# Patient Record
Sex: Male | Born: 1940 | Race: White | Hispanic: No | Marital: Married | Smoking: Former smoker
Health system: Southern US, Community
[De-identification: ages and names within clinical notes are randomized; demographics above are authoritative.]

## PROBLEM LIST (undated history)

## (undated) DIAGNOSIS — N400 Enlarged prostate without lower urinary tract symptoms: Secondary | ICD-10-CM

## (undated) DIAGNOSIS — K449 Diaphragmatic hernia without obstruction or gangrene: Secondary | ICD-10-CM

## (undated) DIAGNOSIS — E7800 Pure hypercholesterolemia, unspecified: Secondary | ICD-10-CM

## (undated) DIAGNOSIS — M1990 Unspecified osteoarthritis, unspecified site: Secondary | ICD-10-CM

## (undated) HISTORY — DX: Benign prostatic hyperplasia without lower urinary tract symptoms: N40.0

## (undated) HISTORY — PX: COLONOSCOPY: SHX174

## (undated) HISTORY — DX: Unspecified osteoarthritis, unspecified site: M19.90

## (undated) HISTORY — DX: Pure hypercholesterolemia, unspecified: E78.00

## (undated) HISTORY — DX: Diaphragmatic hernia without obstruction or gangrene: K44.9

## (undated) HISTORY — PX: TONSILLECTOMY: SUR1361

## (undated) HISTORY — PX: KNEE ARTHROSCOPY: SUR90

---

## 2002-05-20 ENCOUNTER — Encounter: Payer: Self-pay | Admitting: Emergency Medicine

## 2002-05-20 ENCOUNTER — Emergency Department (HOSPITAL_COMMUNITY): Admission: EM | Admit: 2002-05-20 | Discharge: 2002-05-20 | Payer: Self-pay | Admitting: Emergency Medicine

## 2002-11-26 ENCOUNTER — Encounter (INDEPENDENT_AMBULATORY_CARE_PROVIDER_SITE_OTHER): Payer: Self-pay

## 2002-11-26 ENCOUNTER — Ambulatory Visit (HOSPITAL_COMMUNITY): Admission: RE | Admit: 2002-11-26 | Discharge: 2002-11-26 | Payer: Self-pay | Admitting: Gastroenterology

## 2004-12-20 ENCOUNTER — Encounter: Admission: RE | Admit: 2004-12-20 | Discharge: 2004-12-20 | Payer: Self-pay | Admitting: Emergency Medicine

## 2009-11-20 ENCOUNTER — Encounter: Admission: RE | Admit: 2009-11-20 | Discharge: 2009-11-20 | Payer: Self-pay | Admitting: Internal Medicine

## 2011-02-05 NOTE — Op Note (Signed)
NAME:  [name] [name], [name] [name]                           ACCOUNT NO.:  [id number]   MEDICAL RECORD NO.:  [id number]                   PATIENT TYPE:  AMB   LOCATION:  ENDO                                 FACILITY:  [facility]   PHYSICIAN:  Danise Edge, M.D.                DATE OF BIRTH:  05-Sep-1941   DATE OF PROCEDURE:  11/26/2002  DATE OF DISCHARGE:                                 OPERATIVE REPORT   PROCEDURES:  Esophagogastroduodenoscopy to evaluate gastroesophageal reflux  associated with a globus sensation, followed by a screening colonoscopy with  polypectomy to prevent colon cancer.   INDICATIONS:  The patient is a 70 year old male born [birth year].  The patient  has chronic gastroesophageal reflux now associated with a foreign body  sensation in his throat.  He reports no dysphagia or odynophagia.  His  heartburn seems to be controlled on a proton pump inhibitor.  He is also for  his first screening colonoscopy with polypectomy to prevent colon cancer.  His mother has undergone colonoscopic exams in the past to remove colon  polyps.   ENDOSCOPIST:  Danise Edge, M.D.   PREMEDICATION:  Versed 10 mg, Demerol 50 mg.   ENDOSCOPIST:  Olympus gastroscope and pediatric colonoscope.  I started with  the adult colonoscope for his colonoscopy but found the instrument to be too  large and stiff to negotiate his distal colonic diverticulosis.   PROCEDURE:  Esophagogastroduodenoscopy.   DESCRIPTION OF PROCEDURE:  After obtaining informed consent, the patient was  placed in the left lateral decubitus position.  I administered intravenous  Demerol and intravenous Versed to achieve conscious sedation for the  procedure.  The patient's blood pressure, oxygen saturation, and cardiac  rhythm were monitored throughout the procedure and documented in the medical  record.   The Olympus gastroscope was passed through the posterior hypopharynx into  the proximal esophagus without difficulty.  The  hypopharynx, larynx, and  vocal cords appeared completely normal.   Esophagoscopy:  The proxima, mid-, and lower segments of the esophagus  appeared normal.  The squamocolumnar junction and esophagogastric junction  are noted at 40 cm from the incisor teeth.  Endoscopically there is no  evidence for the presence of erosive esophagitis, esophageal mucosal  scarring, esophageal stricture formation, [name]'s esophagus, or esophageal  ulceration.   Gastroscopy:  There is no endoscopic evidence for the presence of a hiatal  hernia.  Retroflexed view of the gastric cardia and fundus was normal.  The  gastric body, antrum, and pylorus appeared normal.   Duodenoscopy:  The duodenal bulb, mid-duodenum, and distal duodenum appeared  normal.   ASSESSMENT:  Normal esophagogastroduodenoscopy.   PROCEDURE:  Screening proctocolonoscopy with sigmoid polypectomy.   DESCRIPTION OF PROCEDURE:  Anal inspection was normal.  Digital rectal exam  was normal.  The Olympus pediatric video colonoscope was introduced into the  rectum and advanced to the cecum.  Colonic preparation for  the exam today  was excellent.   Rectum normal.   Sigmoid colon and descending colon:  At 25 cm from the anal verge a 2 mm  sessile polyp was removed with the electrocautery snare.   Splenic flexure normal.   Transverse colon normal.   Hepatic flexure normal.   Ascending colon normal.   Cecum and ileocecal valve normal.   ASSESSMENT:  1. Left colonic diverticulosis.  2. At 25 cm from the anal verge, from the distal sigmoid colon, a 2 mm     sessile polyp was removed.                                               Danise Edge, M.D.    MJ/MEDQ  D:  11/26/2002  T:  11/26/2002  Job:  [id number]   cc:   Reuben Likes, M.D.  [address]  [place]  [place] [address]  Fax: [phone]

## 2011-07-26 ENCOUNTER — Encounter (HOSPITAL_COMMUNITY): Payer: Self-pay | Admitting: Pharmacy Technician

## 2011-07-29 ENCOUNTER — Ambulatory Visit (HOSPITAL_COMMUNITY)
Admission: RE | Admit: 2011-07-29 | Discharge: 2011-07-29 | Disposition: A | Discharge status: Home / Self Care | Payer: Medicare Other | Source: Ambulatory Visit | Attending: Orthopedic Surgery | Admitting: Orthopedic Surgery

## 2011-07-29 ENCOUNTER — Encounter (HOSPITAL_COMMUNITY): Payer: Medicare Other

## 2011-07-29 ENCOUNTER — Encounter (HOSPITAL_COMMUNITY): Payer: Self-pay

## 2011-07-29 DIAGNOSIS — Z01812 Encounter for preprocedural laboratory examination: Secondary | ICD-10-CM | POA: Insufficient documentation

## 2011-07-29 DIAGNOSIS — Z01818 Encounter for other preprocedural examination: Secondary | ICD-10-CM | POA: Insufficient documentation

## 2011-07-29 LAB — URINALYSIS, ROUTINE W REFLEX MICROSCOPIC
Glucose, UA: NEGATIVE mg/dL
Ketones, ur: NEGATIVE mg/dL
Leukocytes, UA: NEGATIVE
Nitrite: NEGATIVE
Protein, ur: NEGATIVE mg/dL
Urobilinogen, UA: 1 mg/dL (ref 0.0–1.0)

## 2011-07-29 LAB — CBC
Hemoglobin: 15.3 g/dL (ref 13.0–17.0)
MCH: 32.2 pg (ref 26.0–34.0)
MCHC: 35.3 g/dL (ref 30.0–36.0)
MCV: 91.4 fL (ref 78.0–100.0)

## 2011-07-29 LAB — COMPREHENSIVE METABOLIC PANEL
ALT: 21 U/L (ref 0–53)
Calcium: 9.9 mg/dL (ref 8.4–10.5)
Creatinine, Ser: 0.95 mg/dL (ref 0.50–1.35)
GFR calc Af Amer: >90 mL/min (ref >90)
GFR calc non Af Amer: 82 mL/min — ABNORMAL LOW (ref >90)
Glucose, Bld: 85 mg/dL (ref 70–99)
Sodium: 137 mEq/L (ref 135–145)
Total Protein: 7.4 g/dL (ref 6.0–8.3)

## 2011-07-29 LAB — PROTIME-INR
INR: 0.93 (ref 0.00–1.49)
Prothrombin Time: 12.7 seconds (ref 11.6–15.2)

## 2011-07-29 LAB — SURGICAL PCR SCREEN
MRSA, PCR: NEGATIVE
Staphylococcus aureus: NEGATIVE

## 2011-07-29 NOTE — Patient Instructions (Addendum)
[address] Tywaun Hiltner Hamed  07/29/2011   Your procedure is scheduled on: 08/04/11  Report to [facility] at 12:00 PM.  Call this number if you have problems the morning of surgery: [phone]   Remember:   Do not eat food:After Midnight.  Do not drink clear liquids: 4 Hours before arrival.MAY HAVE CLEAR LIQUIDS FROM MIDNIGHT TO 8:00AM  Take these medicines the morning of surgery with A SIP OF WATER: NONE   Do not wear jewelry, make-up or nail polish.  Do not wear lotions, powders, or perfumes. You may wear deodorant.  Do not shave 48 hours prior to surgery.  Do not bring valuables to the hospital.  Contacts, dentures or bridgework may not be worn into surgery.  Leave suitcase in the car. After surgery it may be brought to your room.  For patients admitted to the hospital, checkout time is 11:00 AM the day of discharge.   Patients discharged the day of surgery will not be allowed to drive home.  Name and phone number of your driver:    Special Instructions: Incentive Spirometry - Practice and bring it with you on the day of surgery. and CHG Shower Use Special Wash: 1/2 bottle night before surgery and 1/2 bottle morning of surgery.   Please read over the following fact sheets that you were given: MRSA Information BRING EYE DROPS

## 2011-07-31 ENCOUNTER — Other Ambulatory Visit: Payer: Self-pay | Admitting: Orthopedic Surgery

## 2011-07-31 NOTE — H&P (Signed)
[name] [name] DOB: 03/02/1941  Date of Admission:  08/04/2011  Chief Complaint:  Bilateral Knee Pain  History of Present Illness The patient is a 70 year old male who comes in today for a preoperative History and Physical. The patient is scheduled for a bilateral total knee arthroplasty to be performed by Dr. Frank V. Aluisio, MD at [facility] on November 14,2012 .  Allergies No Known Drug Allergies.  Family History Cancer. grandmother mothers side Cerebrovascular Accident. grandfather mothers side and grandmother fathers side Hypertension. grandmother fathers side Osteoporosis. mother Mother. In good health, Living. Age 90 Father. Deceased. Killed in military accident, age 27  Social History Tobacco use. Former smoker. quit 43 years ago, former smoker; smoke(d) 1 1/2 pack(s) per day Alcohol use. current drinker; drinks wine and hard liquor; 5-7 per week Children. 2 Current work status. retired Drug/Alcohol Rehab (Currently). no Exercise. Exercises weekly; does running / walking and individual sport Illicit drug use. no Living situation. live with spouse Marital status. married Number of flights of stairs before winded. 2-3 flights of stairs. Pain Contract. no Previously in rehab. no Tobacco / smoke exposure. no Post-Surgical Plans. Looking into SNF Advance Directives. Living will, Healthcare POA  Medication History Avodart (0.5MG Capsule, Oral) Active. Lipitor ( Oral) Specific dose unknown - Active. Eye Drops Regular ( Ophthalmic) Specific dose unknown - Active. (eye drops X3 for Glaucoma/patient unsure of names)  Past Surgical History Arthroscopy of Knee. bilateral, Right 4/94, Left 4/95 Colon Polyp Removal - Colonoscopy Tonsillectomy Vasectomy  Medical History Gastroesophageal Reflux Disease Hypercholesterolemia Prostate Disease. Enlarged Prostate Hiatal Hernia Glaucoma Cataract. Early, Mild  Review of  Systems General:Not Present- Chills, Fever, Night Sweats, Appetite Loss, Fatigue, Feeling sick, Weight Gain and Weight Loss. Skin:Not Present- Itching, Rash, Skin Color Changes, Ulcer, Psoriasis and Change in Hair or Nails. HEENT:Present- Decreased Hearing. Not Present- Sensitivity to light, Nose Bleed, Visual Loss and Ringing in the Ears. Neck:Not Present- Swollen Glands and Neck Mass. Respiratory:Not Present- Shortness of breath, Snoring, Chronic Cough and Bloody sputum. Cardiovascular:Not Present- Shortness of Breath, Chest Pain, Swelling of Extremities, Leg Cramps and Palpitations. Gastrointestinal:Not Present- Bloody Stool, Heartburn, Abdominal Pain, Vomiting, Nausea and Incontinence of Stool. Male Genitourinary:Present- Weak urinary stream and Nocturia. Not Present- Blood in Urine, Frequency and Incontinence. Musculoskeletal:Present- Joint Pain and Back Pain. Not Present- Muscle Weakness, Muscle Pain, Joint Stiffness and Joint Swelling. Neurological:Not Present- Tingling, Numbness, Burning, Tremor, Headaches and Dizziness. Psychiatric:Not Present- Anxiety, Depression and Memory Loss. Endocrine:Not Present- Cold Intolerance, Heat Intolerance, Excessive hunger and Excessive Thirst. Hematology:Not Present- Abnormal Bleeding, Abnormal bruising, Anemia and Blood Clots.  Vitals 07/27/2011 2:42 PM Weight: 155 lb Height: 68 in Weight was reported by patient. Height was reported by patient. Body Surface Area: 1.84 m Body Mass Index: 23.57 kg/m Pulse: 64 (Regular) Resp.: 12 (Unlabored) BP: 132/80 (Sitting, Left Arm, Standard)  Physical Exam(DREW L Kace Hartje, PA-C; 07/27/2011 2:57 PM) The physical exam findings are as follows:  General Mental Status - Alert, cooperative and good historian. General Appearance- pleasant. Not in acute distress. Orientation- Oriented X3. Build & Nutrition- Well nourished and Well developed.  Head and Neck Head- normocephalic,  atraumatic . Neck Global Assessment- supple. no bruit auscultated on the right and no bruit auscultated on the left.  Eye Pupil- Bilateral- PERRLA. Motion- Bilateral- EOMI.  Chest and Lung Exam Auscultation: Breath sounds:- clear at anterior chest wall and - clear at posterior chest wall. Adventitious sounds:- No Adventitious sounds.  Cardiovascular Auscultation:Rhythm- Regular rate and rhythm. Heart Sounds-   S1 WNL and S2 WNL. Murmurs & Other Heart Sounds:Auscultation of the heart reveals - No Murmurs.  Abdomen Palpation/Percussion:Tenderness- Abdomen is non-tender to palpation. Rigidity (guarding)- Abdomen is soft. Auscultation:Auscultation of the abdomen reveals - Bowel sounds normal.  Male Genitourinary Note: Not done, not pertinent to present illness  Musculoskeletal Note: Well developed male alert and oriented in no apparent distress. Evaluation of both knees shows varus deformities. He has range of motion about 5 to 125 on both sides. He has marked crepitus on range of motion. He is tender medial greater than lateral with no instability noted.  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 Patient wants to look into Skilled Rehab after hospital stay.  Risks and benefits of the surgery have been discussed with the patient and they elect to proceed with surgery.  There are on active contraindications to upcoming procedure such as ongoing infection or progressive neurological disease. 

## 2011-08-03 ENCOUNTER — Encounter (HOSPITAL_COMMUNITY): Payer: Self-pay | Admitting: Orthopedic Surgery

## 2011-08-03 MED ORDER — BUPIVACAINE 0.25 % ON-Q PUMP SINGLE CATH 300ML
300.0000 mL | INJECTION | Status: DC
  Filled 2011-08-03: qty 300

## 2011-08-04 ENCOUNTER — Encounter (HOSPITAL_COMMUNITY): Payer: Self-pay | Admitting: Anesthesiology

## 2011-08-04 ENCOUNTER — Encounter (HOSPITAL_COMMUNITY): Payer: Self-pay | Admitting: Orthopedic Surgery

## 2011-08-04 ENCOUNTER — Encounter (HOSPITAL_COMMUNITY): Payer: Self-pay | Admitting: *Deleted

## 2011-08-04 ENCOUNTER — Encounter (HOSPITAL_COMMUNITY)
Admission: RE | Disposition: A | Discharge status: Skilled Nursing Facility | Payer: Self-pay | Source: Ambulatory Visit | Attending: Orthopedic Surgery

## 2011-08-04 ENCOUNTER — Inpatient Hospital Stay (HOSPITAL_COMMUNITY): Payer: Medicare Other | Admitting: Anesthesiology

## 2011-08-04 ENCOUNTER — Inpatient Hospital Stay (HOSPITAL_COMMUNITY)
Admission: RE | Admit: 2011-08-04 | Discharge: 2011-08-09 | DRG: 462 | Disposition: A | Discharge status: Skilled Nursing Facility | Payer: Medicare Other | Source: Ambulatory Visit | Attending: Orthopedic Surgery | Admitting: Orthopedic Surgery

## 2011-08-04 DIAGNOSIS — K449 Diaphragmatic hernia without obstruction or gangrene: Secondary | ICD-10-CM | POA: Diagnosis present

## 2011-08-04 DIAGNOSIS — Z79899 Other long term (current) drug therapy: Secondary | ICD-10-CM

## 2011-08-04 DIAGNOSIS — IMO0002 Reserved for concepts with insufficient information to code with codable children: Principal | ICD-10-CM | POA: Diagnosis present

## 2011-08-04 DIAGNOSIS — E7800 Pure hypercholesterolemia, unspecified: Secondary | ICD-10-CM | POA: Diagnosis present

## 2011-08-04 DIAGNOSIS — H9190 Unspecified hearing loss, unspecified ear: Secondary | ICD-10-CM | POA: Diagnosis present

## 2011-08-04 DIAGNOSIS — K219 Gastro-esophageal reflux disease without esophagitis: Secondary | ICD-10-CM | POA: Diagnosis present

## 2011-08-04 DIAGNOSIS — N400 Enlarged prostate without lower urinary tract symptoms: Secondary | ICD-10-CM | POA: Diagnosis present

## 2011-08-04 DIAGNOSIS — M170 Bilateral primary osteoarthritis of knee: Secondary | ICD-10-CM | POA: Diagnosis present

## 2011-08-04 DIAGNOSIS — M21169 Varus deformity, not elsewhere classified, unspecified knee: Secondary | ICD-10-CM | POA: Diagnosis present

## 2011-08-04 DIAGNOSIS — E871 Hypo-osmolality and hyponatremia: Secondary | ICD-10-CM | POA: Diagnosis not present

## 2011-08-04 DIAGNOSIS — Z01812 Encounter for preprocedural laboratory examination: Secondary | ICD-10-CM

## 2011-08-04 DIAGNOSIS — M1710 Unilateral primary osteoarthritis, unspecified knee: Principal | ICD-10-CM | POA: Diagnosis present

## 2011-08-04 DIAGNOSIS — D62 Acute posthemorrhagic anemia: Secondary | ICD-10-CM | POA: Diagnosis not present

## 2011-08-04 HISTORY — PX: TOTAL KNEE ARTHROPLASTY: SHX125

## 2011-08-04 SURGERY — ARTHROPLASTY, KNEE, BILATERAL, TOTAL
Anesthesia: General | Site: Knee | Laterality: Bilateral | Wound class: Clean

## 2011-08-04 MED ORDER — POLYETHYLENE GLYCOL 3350 17 G PO PACK
17.0000 g | PACK | Freq: Every day | ORAL | Status: DC | PRN
  Filled 2011-08-04: qty 1

## 2011-08-04 MED ORDER — SODIUM CHLORIDE 0.9 % IV SOLN
INTRAVENOUS | Status: DC
  Administered 2011-08-06: 07:00:00 via EPIDURAL
  Filled 2011-08-04 (×7): qty 20

## 2011-08-04 MED ORDER — FLEET ENEMA 7-19 GM/118ML RE ENEM: 1.0000 | ENEMA | Freq: Every day | RECTAL | Status: DC | PRN

## 2011-08-04 MED ORDER — MIDAZOLAM HCL 10 MG/2ML IJ SOLN
1.0000 mg | INTRAMUSCULAR | Status: DC | PRN
  Administered 2011-08-04: 2 mg via INTRAVENOUS

## 2011-08-04 MED ORDER — ONDANSETRON HCL 4 MG/2ML IJ SOLN: 4.0000 mg | Freq: Three times a day (TID) | INTRAMUSCULAR | Status: DC | PRN

## 2011-08-04 MED ORDER — METHOCARBAMOL 100 MG/ML IJ SOLN
500.0000 mg | Freq: Four times a day (QID) | INTRAVENOUS | Status: DC | PRN
  Filled 2011-08-04: qty 5

## 2011-08-04 MED ORDER — LIDOCAINE HCL (PF) 2 % IJ SOLN
INTRAMUSCULAR | Status: DC | PRN
  Administered 2011-08-04: 5 mL
  Administered 2011-08-04: 10 mL
  Administered 2011-08-04: 4 mL

## 2011-08-04 MED ORDER — DORZOLAMIDE HCL-TIMOLOL MAL 2-0.5 % OP SOLN
1.0000 [drp] | Freq: Two times a day (BID) | OPHTHALMIC | Status: DC
  Administered 2011-08-04 – 2011-08-08 (×5): 1 [drp] via OPHTHALMIC
  Filled 2011-08-04: qty 10

## 2011-08-04 MED ORDER — IBUPROFEN 600 MG PO TABS
600.0000 mg | ORAL_TABLET | Freq: Four times a day (QID) | ORAL | Status: DC | PRN
  Filled 2011-08-04: qty 1

## 2011-08-04 MED ORDER — MENTHOL 3 MG MT LOZG: 1.0000 | LOZENGE | OROMUCOSAL | Status: DC | PRN

## 2011-08-04 MED ORDER — SODIUM CHLORIDE 0.9 % IR SOLN
Status: DC | PRN
  Administered 2011-08-04: 1000 mL

## 2011-08-04 MED ORDER — ONDANSETRON HCL 4 MG/2ML IJ SOLN: 4.0000 mg | Freq: Four times a day (QID) | INTRAMUSCULAR | Status: DC | PRN

## 2011-08-04 MED ORDER — MAGNESIUM HYDROXIDE 400 MG/5ML PO SUSP: 30.0000 mL | Freq: Two times a day (BID) | ORAL | Status: DC | PRN

## 2011-08-04 MED ORDER — KETOROLAC TROMETHAMINE 30 MG/ML IJ SOLN: 30.0000 mg | Freq: Four times a day (QID) | INTRAMUSCULAR | Status: DC | PRN

## 2011-08-04 MED ORDER — BUPIVACAINE HCL (PF) 0.5 % IJ SOLN
INTRAMUSCULAR | Status: DC | PRN
  Administered 2011-08-04: 10 mL
  Administered 2011-08-04: 5 mL
  Administered 2011-08-04: 10 mL
  Administered 2011-08-04 (×2): 5 mL
  Administered 2011-08-04: 10 mL
  Administered 2011-08-04: 5 mL
  Administered 2011-08-04: 10 mL

## 2011-08-04 MED ORDER — ACETAMINOPHEN 10 MG/ML IV SOLN
1000.0000 mg | Freq: Four times a day (QID) | INTRAVENOUS | Status: AC
  Administered 2011-08-04 – 2011-08-05 (×4): 1000 mg via INTRAVENOUS
  Filled 2011-08-04 (×8): qty 100

## 2011-08-04 MED ORDER — TEMAZEPAM 15 MG PO CAPS: 15.0000 mg | ORAL_CAPSULE | Freq: Every evening | ORAL | Status: DC | PRN

## 2011-08-04 MED ORDER — MEPERIDINE HCL 25 MG/ML IJ SOLN: 6.2500 mg | INTRAMUSCULAR | Status: DC | PRN

## 2011-08-04 MED ORDER — DIPHENHYDRAMINE HCL 12.5 MG/5ML PO ELIX: 12.5000 mg | ORAL_SOLUTION | ORAL | Status: DC | PRN

## 2011-08-04 MED ORDER — ACETAMINOPHEN 10 MG/ML IV SOLN
INTRAVENOUS | Status: DC | PRN
  Administered 2011-08-04: 1000 mg via INTRAVENOUS

## 2011-08-04 MED ORDER — SIMVASTATIN 20 MG PO TABS
20.0000 mg | ORAL_TABLET | Freq: Every day | ORAL | Status: DC
  Administered 2011-08-04 – 2011-08-05 (×2): 20 mg via ORAL
  Filled 2011-08-04 (×4): qty 1

## 2011-08-04 MED ORDER — NALOXONE HCL 0.4 MG/ML IJ SOLN: 0.4000 mg | INTRAMUSCULAR | Status: DC | PRN

## 2011-08-04 MED ORDER — BIMATOPROST 0.01 % OP SOLN
1.0000 [drp] | Freq: Every day | OPHTHALMIC | Status: DC
  Administered 2011-08-04 – 2011-08-06 (×3): 1 [drp] via OPHTHALMIC
  Filled 2011-08-04: qty 5

## 2011-08-04 MED ORDER — METHOCARBAMOL 500 MG PO TABS
500.0000 mg | ORAL_TABLET | Freq: Four times a day (QID) | ORAL | Status: DC | PRN
  Administered 2011-08-06 (×2): 500 mg via ORAL
  Filled 2011-08-04 (×2): qty 1

## 2011-08-04 MED ORDER — ONDANSETRON HCL 4 MG PO TABS: 4.0000 mg | ORAL_TABLET | Freq: Four times a day (QID) | ORAL | Status: DC | PRN

## 2011-08-04 MED ORDER — SODIUM CHLORIDE 0.9 % IJ SOLN: 3.0000 mL | INTRAMUSCULAR | Status: DC | PRN

## 2011-08-04 MED ORDER — SCOPOLAMINE 1 MG/3DAYS TD PT72
1.0000 | MEDICATED_PATCH | Freq: Once | TRANSDERMAL | Status: DC
  Filled 2011-08-04: qty 1

## 2011-08-04 MED ORDER — DIPHENHYDRAMINE HCL 50 MG/ML IJ SOLN
12.5000 mg | INTRAMUSCULAR | Status: DC | PRN
  Administered 2011-08-05 (×2): 12.5 mg via INTRAVENOUS
  Filled 2011-08-04 (×2): qty 1

## 2011-08-04 MED ORDER — DIPHENHYDRAMINE HCL 50 MG/ML IJ SOLN: 25.0000 mg | INTRAMUSCULAR | Status: DC | PRN

## 2011-08-04 MED ORDER — OXYCODONE HCL 5 MG PO TABS
5.0000 mg | ORAL_TABLET | ORAL | Status: DC | PRN
  Administered 2011-08-06 (×2): 5 mg via ORAL
  Administered 2011-08-06: 10 mg via ORAL
  Administered 2011-08-07 – 2011-08-09 (×5): 5 mg via ORAL
  Filled 2011-08-04 (×2): qty 2
  Filled 2011-08-04 (×3): qty 1
  Filled 2011-08-04: qty 2
  Filled 2011-08-04: qty 1
  Filled 2011-08-04: qty 2

## 2011-08-04 MED ORDER — LACTATED RINGERS IV SOLN
INTRAVENOUS | Status: DC
  Administered 2011-08-04: 1000 mL via INTRAVENOUS
  Administered 2011-08-04: 15:00:00 via INTRAVENOUS
  Administered 2011-08-04: 1000 mL via INTRAVENOUS
  Administered 2011-08-04: 18:00:00 via INTRAVENOUS

## 2011-08-04 MED ORDER — NALBUPHINE HCL 10 MG/ML IJ SOLN
5.0000 mg | INTRAMUSCULAR | Status: DC | PRN
  Filled 2011-08-04: qty 1

## 2011-08-04 MED ORDER — METOCLOPRAMIDE HCL 5 MG/ML IJ SOLN: 10.0000 mg | Freq: Three times a day (TID) | INTRAMUSCULAR | Status: DC | PRN

## 2011-08-04 MED ORDER — DIPHENHYDRAMINE HCL 12.5 MG/5ML PO ELIX
12.5000 mg | ORAL_SOLUTION | Freq: Four times a day (QID) | ORAL | Status: DC | PRN
  Filled 2011-08-04: qty 5

## 2011-08-04 MED ORDER — NIACIN 100 MG PO TABS: 100.0000 mg | ORAL_TABLET | Freq: Every day | ORAL | Status: DC

## 2011-08-04 MED ORDER — DEXTROSE-NACL 5-0.9 % IV SOLN
INTRAVENOUS | Status: DC
  Administered 2011-08-05 – 2011-08-07 (×3): via INTRAVENOUS

## 2011-08-04 MED ORDER — NIACIN 250 MG PO TABS
250.0000 mg | ORAL_TABLET | Freq: Every day | ORAL | Status: DC
  Administered 2011-08-07 – 2011-08-08 (×2): 250 mg via ORAL
  Filled 2011-08-04 (×7): qty 1

## 2011-08-04 MED ORDER — BRIMONIDINE TARTRATE 0.2 % OP SOLN
1.0000 [drp] | Freq: Two times a day (BID) | OPHTHALMIC | Status: DC
  Administered 2011-08-04 – 2011-08-08 (×5): 1 [drp] via OPHTHALMIC
  Filled 2011-08-04: qty 5

## 2011-08-04 MED ORDER — HYDROMORPHONE HCL PF 1 MG/ML IJ SOLN
0.2500 mg | INTRAMUSCULAR | Status: DC | PRN
  Administered 2011-08-04 (×2): 0.5 mg via INTRAVENOUS

## 2011-08-04 MED ORDER — MIDAZOLAM HCL 2 MG/2ML IJ SOLN: 1.0000 mg | INTRAMUSCULAR | Status: DC | PRN

## 2011-08-04 MED ORDER — DEXAMETHASONE SODIUM PHOSPHATE 4 MG/ML IJ SOLN
8.0000 mg | Freq: Once | INTRAMUSCULAR | Status: DC | PRN
  Filled 2011-08-04: qty 2

## 2011-08-04 MED ORDER — BISACODYL 10 MG RE SUPP: 10.0000 mg | Freq: Every day | RECTAL | Status: DC | PRN

## 2011-08-04 MED ORDER — KETOROLAC TROMETHAMINE 60 MG/2ML IM SOLN
60.0000 mg | Freq: Once | INTRAMUSCULAR | Status: DC | PRN
  Filled 2011-08-04: qty 2

## 2011-08-04 MED ORDER — DIPHENHYDRAMINE HCL 50 MG/ML IJ SOLN: 12.5000 mg | Freq: Four times a day (QID) | INTRAMUSCULAR | Status: DC | PRN

## 2011-08-04 MED ORDER — DUTASTERIDE 0.5 MG PO CAPS
0.5000 mg | ORAL_CAPSULE | Freq: Every evening | ORAL | Status: DC
  Administered 2011-08-04 – 2011-08-08 (×5): 0.5 mg via ORAL
  Filled 2011-08-04 (×7): qty 1

## 2011-08-04 MED ORDER — SODIUM CHLORIDE 0.9 % IV SOLN
INTRAVENOUS | Status: DC
  Administered 2011-08-04: 19:00:00 via EPIDURAL
  Filled 2011-08-04 (×4): qty 20

## 2011-08-04 MED ORDER — BUPIVACAINE HCL (PF) 0.5 % IJ SOLN
INTRAMUSCULAR | Status: AC
  Filled 2011-08-04: qty 30

## 2011-08-04 MED ORDER — BISACODYL 5 MG PO TBEC: 10.0000 mg | DELAYED_RELEASE_TABLET | Freq: Every day | ORAL | Status: DC | PRN

## 2011-08-04 MED ORDER — ACETAMINOPHEN 325 MG PO TABS: 650.0000 mg | ORAL_TABLET | Freq: Four times a day (QID) | ORAL | Status: DC | PRN

## 2011-08-04 MED ORDER — METOCLOPRAMIDE HCL 10 MG PO TABS: 5.0000 mg | ORAL_TABLET | Freq: Three times a day (TID) | ORAL | Status: DC | PRN

## 2011-08-04 MED ORDER — DOCUSATE SODIUM 100 MG PO CAPS
100.0000 mg | ORAL_CAPSULE | Freq: Two times a day (BID) | ORAL | Status: DC
  Administered 2011-08-04 – 2011-08-09 (×8): 100 mg via ORAL
  Filled 2011-08-04 (×13): qty 1

## 2011-08-04 MED ORDER — SODIUM BICARBONATE 8.4 % IV SOLN
INTRAVENOUS | Status: DC | PRN
  Administered 2011-08-04: 25 meq via INTRAVENOUS

## 2011-08-04 MED ORDER — CEFAZOLIN SODIUM 1-5 GM-% IV SOLN
1.0000 g | Freq: Once | INTRAVENOUS | Status: AC
  Administered 2011-08-04: 1 g via INTRAVENOUS

## 2011-08-04 MED ORDER — DIPHENHYDRAMINE HCL 25 MG PO CAPS
25.0000 mg | ORAL_CAPSULE | ORAL | Status: DC | PRN
  Filled 2011-08-04: qty 1

## 2011-08-04 MED ORDER — METOCLOPRAMIDE HCL 5 MG/ML IJ SOLN: 5.0000 mg | Freq: Three times a day (TID) | INTRAMUSCULAR | Status: DC | PRN

## 2011-08-04 MED ORDER — SODIUM CHLORIDE 0.9 % IV SOLN
1.0000 ug/kg/h | INTRAVENOUS | Status: DC | PRN
  Filled 2011-08-04: qty 2.5

## 2011-08-04 MED ORDER — LACTATED RINGERS IV SOLN: INTRAVENOUS | Status: DC

## 2011-08-04 MED ORDER — PHENOL 1.4 % MT LIQD: 1.0000 | OROMUCOSAL | Status: DC | PRN

## 2011-08-04 MED ORDER — HYDROMORPHONE HCL PF 1 MG/ML IJ SOLN
INTRAMUSCULAR | Status: AC
  Filled 2011-08-04: qty 1

## 2011-08-04 MED ORDER — MORPHINE SULFATE (PF) 1 MG/ML IV SOLN
INTRAVENOUS | Status: DC
  Administered 2011-08-04: 19:00:00 via INTRAVENOUS
  Administered 2011-08-06: 1.99 mg via INTRAVENOUS
  Administered 2011-08-06: 1 mg via INTRAVENOUS
  Administered 2011-08-06: 19 mg via INTRAVENOUS
  Administered 2011-08-06: 12:00:00 via INTRAVENOUS
  Administered 2011-08-06: 2 mg via INTRAVENOUS
  Administered 2011-08-07: 7 mg via INTRAVENOUS
  Administered 2011-08-07: 1 mg via INTRAVENOUS
  Filled 2011-08-04 (×2): qty 25

## 2011-08-04 MED ORDER — PROPOFOL 10 MG/ML IV EMUL
INTRAVENOUS | Status: DC | PRN
  Administered 2011-08-04: 100 ug/kg/min via INTRAVENOUS

## 2011-08-04 MED ORDER — CEFAZOLIN SODIUM 1-5 GM-% IV SOLN
1.0000 g | Freq: Four times a day (QID) | INTRAVENOUS | Status: AC
  Administered 2011-08-04 – 2011-08-05 (×3): 1 g via INTRAVENOUS
  Filled 2011-08-04 (×3): qty 50

## 2011-08-04 MED ORDER — SODIUM CHLORIDE 0.9 % IJ SOLN: 9.0000 mL | INTRAMUSCULAR | Status: DC | PRN

## 2011-08-04 MED ORDER — ACETAMINOPHEN 650 MG RE SUPP: 650.0000 mg | Freq: Four times a day (QID) | RECTAL | Status: DC | PRN

## 2011-08-04 SURGICAL SUPPLY — 58 items
AUTOTRANSFUSION W/QD PVC DRAIN (AUTOTRANSFUSION) ×4 IMPLANT
BAG SPEC THK2 15X12 ZIP CLS (MISCELLANEOUS) ×2
BAG ZIPLOCK 12X15 (MISCELLANEOUS) ×4 IMPLANT
BANDAGE ELASTIC 6 VELCRO ST LF (GAUZE/BANDAGES/DRESSINGS) ×4 IMPLANT
BANDAGE ESMARK 6X9 LF (GAUZE/BANDAGES/DRESSINGS) ×2 IMPLANT
BLADE SAG 18X100X1.27 (BLADE) ×2 IMPLANT
BLADE SAW SGTL 11.0X1.19X90.0M (BLADE) ×2 IMPLANT
BLADE SURG SZ10 CARB STEEL (BLADE) ×4 IMPLANT
BNDG CMPR 9X6 STRL LF SNTH (GAUZE/BANDAGES/DRESSINGS) ×2
BNDG COHESIVE 6X5 TAN STRL LF (GAUZE/BANDAGES/DRESSINGS) ×2 IMPLANT
BNDG ESMARK 6X9 LF (GAUZE/BANDAGES/DRESSINGS) ×4
BOWL SMART MIX CTS (DISPOSABLE) ×4 IMPLANT
CEMENT HV SMART SET (Cement) ×8 IMPLANT
CLOTH BEACON ORANGE TIMEOUT ST (SAFETY) ×2 IMPLANT
CUFF TOURN SGL QUICK 34 (TOURNIQUET CUFF) ×4
CUFF TRNQT CYL 34X4X40X1 (TOURNIQUET CUFF) ×2 IMPLANT
DRAPE EXTREMITY BILATERAL (DRAPE) ×2 IMPLANT
DRAPE INCISE IOBAN 66X45 STRL (DRAPES) ×2 IMPLANT
DRAPE POUCH INSTRU U-SHP 10X18 (DRAPES) ×2 IMPLANT
DRAPE U-SHAPE 47X51 STRL (DRAPES) ×6 IMPLANT
DRSG ADAPTIC 3X8 NADH LF (GAUZE/BANDAGES/DRESSINGS) ×4 IMPLANT
DRSG PAD ABDOMINAL 8X10 ST (GAUZE/BANDAGES/DRESSINGS) ×4 IMPLANT
DURAPREP 26ML APPLICATOR (WOUND CARE) ×4 IMPLANT
ELECT REM PT RETURN 9FT ADLT (ELECTROSURGICAL) ×2
ELECTRODE REM PT RTRN 9FT ADLT (ELECTROSURGICAL) ×1 IMPLANT
EVACUATOR 1/8 PVC DRAIN (DRAIN) ×4 IMPLANT
FACESHIELD LNG OPTICON STERILE (SAFETY) ×16 IMPLANT
GLOVE BIO SURGEON STRL SZ7.5 (GLOVE) ×4 IMPLANT
GLOVE BIO SURGEON STRL SZ8 (GLOVE) ×4 IMPLANT
GLOVE BIOGEL PI IND STRL 8 (GLOVE) ×2 IMPLANT
GLOVE BIOGEL PI INDICATOR 8 (GLOVE) ×2
GOWN PREVENTION PLUS XLARGE (GOWN DISPOSABLE) ×2 IMPLANT
GOWN STRL REIN XL XLG (GOWN DISPOSABLE) ×2 IMPLANT
HANDPIECE INTERPULSE COAX TIP (DISPOSABLE) ×2
IMMOBILIZER KNEE 20 (SOFTGOODS) ×4
IMMOBILIZER KNEE 20 THIGH 36 (SOFTGOODS) ×2 IMPLANT
KIT BASIN OR (CUSTOM PROCEDURE TRAY) ×2 IMPLANT
MANIFOLD NEPTUNE II (INSTRUMENTS) ×2 IMPLANT
NDL SAFETY ECLIPSE 18X1.5 (NEEDLE) ×1 IMPLANT
NEEDLE HYPO 18GX1.5 SHARP (NEEDLE) ×2
NS IRRIG 1000ML POUR BTL (IV SOLUTION) ×2 IMPLANT
PACK TOTAL JOINT (CUSTOM PROCEDURE TRAY) ×2 IMPLANT
PADDING CAST COTTON 6X4 STRL (CAST SUPPLIES) ×10 IMPLANT
SET HNDPC FAN SPRY TIP SCT (DISPOSABLE) ×1 IMPLANT
SPONGE GAUZE 4X4 12PLY (GAUZE/BANDAGES/DRESSINGS) ×4 IMPLANT
SPONGE LAP 18X18 X RAY DECT (DISPOSABLE) ×2 IMPLANT
STOCKINETTE 8 INCH (MISCELLANEOUS) ×2 IMPLANT
STRIP CLOSURE SKIN 1/2X4 (GAUZE/BANDAGES/DRESSINGS) ×8 IMPLANT
SUCTION FRAZIER 12FR DISP (SUCTIONS) ×2 IMPLANT
SUT MNCRL AB 4-0 PS2 18 (SUTURE) ×4 IMPLANT
SUT PDS AB 1 CT1 27 (SUTURE) ×12 IMPLANT
SUT VIC AB 2-0 CT1 27 (SUTURE) ×12
SUT VIC AB 2-0 CT1 TAPERPNT 27 (SUTURE) ×6 IMPLANT
SYR 50ML LL SCALE MARK (SYRINGE) ×2 IMPLANT
TOWEL OR 17X26 10 PK STRL BLUE (TOWEL DISPOSABLE) ×4 IMPLANT
TRAY FOLEY CATH 14FRSI W/METER (CATHETERS) ×2 IMPLANT
WATER STERILE IRR 1500ML POUR (IV SOLUTION) ×2 IMPLANT
WRAP KNEE MAXI GEL POST OP (GAUZE/BANDAGES/DRESSINGS) ×4 IMPLANT

## 2011-08-04 NOTE — H&P (View-Only) (Signed)
[name] [name] DOB: Sep 05, 1941  Date of Admission:  08/04/2011  Chief Complaint:  Bilateral Knee Pain  History of Present Illness The patient is a 70 year old male who comes in today for a preoperative History and Physical. The patient is scheduled for a bilateral total knee arthroplasty to be performed by Dr. Gus Rankin. Aluisio, MD at [facility] on November 14,2012 .  Allergies No Known Drug Allergies.  Family History Cancer. grandmother mothers side Cerebrovascular Accident. grandfather mothers side and grandmother fathers side Hypertension. grandmother fathers side Osteoporosis. mother Mother. In good health, Living. Age 39 Father. Deceased. Killed in [employer] accident, age 36  Social History Tobacco use. Former smoker. quit 43 years ago, former smoker; smoke(d) 1 1/2 pack(s) per day Alcohol use. current drinker; drinks wine and hard liquor; 5-7 per week Children. 2 Current work status. retired Financial planner (Currently). no Exercise. Exercises weekly; does running / walking and individual sport Illicit drug use. no Living situation. live with spouse Marital status. married Number of flights of stairs before winded. 2-3 flights of stairs. Pain Contract. no Previously in rehab. no Tobacco / smoke exposure. no Post-Surgical Plans. Looking into SNF Advance Directives. Living will, Healthcare POA  Medication History Avodart (0.5MG  Capsule, Oral) Active. Lipitor ( Oral) Specific dose unknown - Active. Eye Drops Regular ( Ophthalmic) Specific dose unknown - Active. (eye drops X3 for Glaucoma/patient unsure of names)  Past Surgical History Arthroscopy of Knee. bilateral, Right 4/94, Left 4/95 Colon Polyp Removal - Colonoscopy Tonsillectomy Vasectomy  Medical History Gastroesophageal Reflux Disease Hypercholesterolemia Prostate Disease. Enlarged Prostate Hiatal Hernia Glaucoma Cataract. Early, Mild  Review of  Systems General:Not Present- Chills, Fever, Night Sweats, Appetite Loss, Fatigue, Feeling sick, Weight Gain and Weight Loss. Skin:Not Present- Itching, Rash, Skin Color Changes, Ulcer, Psoriasis and Change in Hair or Nails. HEENT:Present- Decreased Hearing. Not Present- Sensitivity to light, Nose Bleed, Visual Loss and Ringing in the Ears. Neck:Not Present- Swollen Glands and Neck Mass. Respiratory:Not Present- Shortness of breath, Snoring, Chronic Cough and Bloody sputum. Cardiovascular:Not Present- Shortness of Breath, Chest Pain, Swelling of Extremities, Leg Cramps and Palpitations. Gastrointestinal:Not Present- Bloody Stool, Heartburn, Abdominal Pain, Vomiting, Nausea and Incontinence of Stool. Male Genitourinary:Present- Weak urinary stream and Nocturia. Not Present- Blood in Urine, Frequency and Incontinence. Musculoskeletal:Present- Joint Pain and Back Pain. Not Present- Muscle Weakness, Muscle Pain, Joint Stiffness and Joint Swelling. Neurological:Not Present- Tingling, Numbness, Burning, Tremor, Headaches and Dizziness. Psychiatric:Not Present- Anxiety, Depression and Memory Loss. Endocrine:Not Present- Cold Intolerance, Heat Intolerance, Excessive hunger and Excessive Thirst. Hematology:Not Present- Abnormal Bleeding, Abnormal bruising, Anemia and Blood Clots.  Vitals 07/27/2011 2:42 PM Weight: 155 lb Height: 68 in Weight was reported by patient. Height was reported by patient. Body Surface Area: 1.84 m Body Mass Index: 23.57 kg/m Pulse: 64 (Regular) Resp.: 12 (Unlabored) BP: 132/80 (Sitting, Left Arm, Standard)  Physical Exam(DREW L Natalie Mceuen, PA-C; 07/27/2011 2:57 PM) The physical exam findings are as follows:  General Mental Status - Alert, cooperative and good historian. General Appearance- pleasant. Not in acute distress. Orientation- Oriented X3. Build & Nutrition- Well nourished and Well developed.  Head and Neck Head- normocephalic,  atraumatic . Neck Global Assessment- supple. no bruit auscultated on the right and no bruit auscultated on the left.  Eye Pupil- Bilateral- PERRLA. Motion- Bilateral- EOMI.  Chest and Lung Exam Auscultation: Breath sounds:- clear at anterior chest wall and - clear at posterior chest wall. Adventitious sounds:- No Adventitious sounds.  Cardiovascular Auscultation:Rhythm- Regular rate and rhythm. Heart Sounds-  S1 WNL and S2 WNL. Murmurs & Other Heart Sounds:Auscultation of the heart reveals - No Murmurs.  Abdomen Palpation/Percussion:Tenderness- Abdomen is non-tender to palpation. Rigidity (guarding)- Abdomen is soft. Auscultation:Auscultation of the abdomen reveals - Bowel sounds normal.  Male Genitourinary Note: Not done, not pertinent to present illness  Musculoskeletal Note: Well developed male alert and oriented in no apparent distress. Evaluation of both knees shows varus deformities. He has range of motion about 5 to 125 on both sides. He has marked crepitus on range of motion. He is tender medial greater than lateral with no instability noted.  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 Patient wants to look into Skilled Rehab after hospital stay.  Risks and benefits of the surgery have been discussed with the patient and they elect to proceed with surgery.  There are on active contraindications to upcoming procedure such as ongoing infection or progressive neurological disease.

## 2011-08-04 NOTE — Transfer of Care (Signed)
Immediate Anesthesia Transfer of Care Note  Patient: [name] [name]  Procedure(s) Performed:  TOTAL KNEE BILATERAL - epidural done in holding area without incident  Patient Location: PACU  Anesthesia Type: MAC and Epidural  Level of Consciousness: sedated, patient cooperative and responds to stimulaton  Airway & Oxygen Therapy: Patient Spontanous Breathing and Patient connected to face mask oxgen  Post-op Assessment: Report given to PACU RN and Post -op Vital signs reviewed and stable  Post vital signs: Reviewed and stable  Complications: No apparent anesthesia complications

## 2011-08-04 NOTE — Anesthesia Postprocedure Evaluation (Signed)
  Anesthesia Post-op Note  Patient: [name] [name]  Procedure(s) Performed:  TOTAL KNEE BILATERAL - epidural done in holding area without incident  Patient Location: PACU  Anesthesia Type: General  Level of Consciousness: awake and alert   Airway and Oxygen Therapy: Patient Spontanous Breathing  Post-op Pain: mild  Post-op Assessment: Post-op Vital signs reviewed, Patient's Cardiovascular Status Stable, Respiratory Function Stable, Patent Airway and No signs of Nausea or vomiting  Post-op Vital Signs: stable  Complications: No apparent anesthesia complications

## 2011-08-04 NOTE — Anesthesia Preprocedure Evaluation (Signed)
Anesthesia Evaluation  Patient identified by MRN, date of birth, ID band Patient awake    Reviewed: Allergy & Precautions, H&P , NPO status , Patient's Chart, lab work & pertinent test results  Airway Mallampati: II TM Distance: >3 FB Neck ROM: Full    Dental No notable dental hx.    Pulmonary neg pulmonary ROS,  clear to auscultation  Pulmonary exam normal       Cardiovascular neg cardio ROS Regular Normal    Neuro/Psych Negative Neurological ROS  Negative Psych ROS   GI/Hepatic negative GI ROS, Neg liver ROS, hiatal hernia,   Endo/Other  Negative Endocrine ROS  Renal/GU negative Renal ROS  Genitourinary negative   Musculoskeletal negative musculoskeletal ROS (+)   Abdominal   Peds negative pediatric ROS (+)  Hematology negative hematology ROS (+)   Anesthesia Other Findings   Reproductive/Obstetrics negative OB ROS                           Anesthesia Physical Anesthesia Plan  ASA: I  Anesthesia Plan: Epidural   Post-op Pain Management:    Induction:   Airway Management Planned:   Additional Equipment:   Intra-op Plan:   Post-operative Plan:   Informed Consent:   Dental advisory given  Plan Discussed with: CRNA  Anesthesia Plan Comments:         Anesthesia Quick Evaluation

## 2011-08-04 NOTE — Anesthesia Procedure Notes (Signed)
Epidural Patient location during procedure: holding area Reason for block: post-op pain management  Staffing Performed by: anesthesiologist   Preanesthetic Checklist Completed: patient identified, site marked, surgical consent, pre-op evaluation, timeout performed, IV checked, risks and benefits discussed and monitors and equipment checked  Epidural Patient position: sitting Prep: Betadine Patient monitoring: heart rate, continuous pulse ox and blood pressure Approach: left paramedian Injection technique: LOR saline  Needle:  Needle type: Hustead  Needle gauge: 18 G Needle length: 9 cm Needle insertion depth: 6 cm Catheter type: closed end flexible Catheter size: 20 Guage Catheter at skin depth: 10 cm Test dose: negative and 1.5% lidocaine  Additional Notes Test dose 1.5% Lidocaine with epi 1:200,000  Patient tolerated the insertion well without complications.

## 2011-08-04 NOTE — Progress Notes (Signed)
Orthopedic Tech Progress Note Patient Details:  [name] [name] 1940/10/18 [id number]  CPM Left Knee CPM Left Knee: On Left Knee Flexion (Degrees): 40  Left Knee Extension (Degrees): 10    Tawni Carnes [facility] 08/04/2011, 7:26 PM

## 2011-08-04 NOTE — Interval H&P Note (Signed)
History and Physical Interval Note:   08/04/2011   3:50 PM   [name] [name]  has presented today for surgery, with the diagnosis of Osteoarthritis of Bilateral Knees  The various methods of treatment have been discussed with the patient and family. After consideration of risks, benefits and other options for treatment, the patient has consented to  Procedure(s): TOTAL KNEE BILATERAL as a surgical intervention .  The patients' history has been reviewed, patient examined, no change in status, stable for surgery.  I have reviewed the patients' chart and labs.  Questions were answered to the patient's satisfaction.     Loanne Drilling  MD  Pt examined. History and physical unchanged  Gus Rankin. Estanislao Harmon, MD    08/04/2011, 3:50 PM

## 2011-08-04 NOTE — Op Note (Signed)
Pre-operative diagnosis- Osteoarthritis  Bilateral knee(s)  Post-operative diagnosis- Osteoarthritis Bilateral knee(s)  Procedure- Bilateral Total Knee Arthroplasty  Surgeon- Gus Rankin. Dillard Pascal, MD  Assistant- Avel Peace, PA-C   Anesthesia-  Epidural EBL-* No blood loss amount entered *  Drains Autovac x 1 each side  Tourniquet time-  Total Tourniquet Time Documented: Thigh (Left) - 39 minutes Thigh (Right) - 35 minutes   Complications- None  Condition-PACU - hemodynamically stable.   Brief Clinical Note- [name] [name] is a 70 y.o. year old male with end stage OA of both knees with progressively worsening pain and dysfunction. He has constant pain, with activity and at rest and significant functional deficits with difficulties even with ADLs. He has had extensive non-op management including analgesics, injections of cortisone and viscosupplements, and home exercise program, but remains in significant pain with significant dysfunction. Radiographs show bone on bone arthritis of both knees in the medial and patellofemoral compartments with significant varus deformities and bilateral tibial subluxation.He presents now for Bilateral Total Knee Arthroplasty.    Procedure in detail---   The patient is brought into the operating room and positioned supine on the operating table. After successful administration of  Epidural,   a tourniquet is placed high on the  Bilateral thigh(s) and the lower extremity is prepped and draped in the usual sterile fashion. Time out is performed by the operating team and then the  Left lower extremity is wrapped in Esmarch, knee flexed and the tourniquet inflated to 300 mmHg. I did the left side first because it was more symptomatic.      A midline incision is made with a ten blade through the subcutaneous tissue to the level of the extensor mechanism. A fresh blade is used to make a medial parapatellar arthrotomy. Soft tissue over the proximal medial tibia is  subperiosteally elevated to the joint line with a knife and into the semimembranosus bursa with a Cobb elevator. Soft tissue over the proximal lateral tibia is elevated with attention being paid to avoiding the patellar tendon on the tibial tubercle. The patella is everted, knee flexed 90 degrees and the ACL and PCL are removed. Findings are bone on bone medial and patellofemoral with large medial osteophytes.        The drill is used to create a starting hole in the distal femur and the canal is thoroughly irrigated with sterile saline to remove the fatty contents. The 5 degree Left  valgus alignment guide is placed into the femoral canal and the distal femoral cutting block is pinned to remove 11 mm off the distal femur. Resection is made with an oscillating saw.      The tibia is subluxed forward and the menisci are removed. The extramedullary alignment guide is placed referencing proximally at the medial aspect of the tibial tubercle and distally along the second metatarsal axis and tibial crest. The block is pinned to remove 2mm off the more deficient medial  side. Resection is made with an oscillating saw. Size 4is the most appropriate size for the tibia and the proximal tibia is prepared with the modular drill and keel punch for that size.      The femoral sizing guide is placed and size 4 narrow is most appropriate. Rotation is marked off the epicondylar axis and confirmed by creating a rectangular flexion gap at 90 degrees. The size 4 cutting block is pinned in this rotation and the anterior, posterior and chamfer cuts are made with the oscillating saw. The intercondylar  block is then placed and that cut is made.      Trial size 4 tibial component, trial size 4 narrow posterior stabilized femur and a 12.5  mm posterior stabilized rotating platform insert trial is placed. Full extension is achieved with excellent varus/valgus and anterior/posterior balance throughout full range of motion. The patella is  everted and thickness measured to be 26  mm. Free hand resection is taken to 14 mm, a 41 template is placed, lug holes are drilled, trial patella is placed, and it tracks normally. Osteophytes are removed off the posterior femur with the trial in place. All trials are removed and the cut bone surfaces prepared with pulsatile lavage. Cement is mixed and once ready for implantation, the size 4 tibial implant, size 4 narrow posterior stabilized femoral component, and the size 41 patella are cemented in place and the patella is held with the clamp. The trial insert is placed and the knee held in full extension. All extruded cement is removed and once the cement is hard the permanent 12.5 mm posterior stabilized rotating platform insert is placed into the tibial tray.      The wound is copiously irrigated with saline solution and the extensor mechanism closed over a Autovac drain with #1 PDS suture. The tourniquet is released for a total tourniquet time of 38  minutes. Flexion against gravity is 140 degrees and the patella tracks normally. Subcutaneous tissue is closed with 2.0 vicryl and subcuticular with running 4.0 Monocryl. The right side is then addressed.      Right lower extremity is wrapped in Esmarch, knee flexed and the tourniquet inflated to 300 mmHg.  A midline incision is made with a ten blade through the subcutaneous tissue to the level of the extensor mechanism. A fresh blade is used to make a medial parapatellar arthrotomy. Soft tissue over the proximal medial tibia is subperiosteally elevated to the joint line with a knife and into the semimembranosus bursa with a Cobb elevator. Soft tissue over the proximal lateral tibia is elevated with attention being paid to avoiding the patellar tendon on the tibial tubercle. The patella is everted, knee flexed 90 degrees and the ACL and PCL are removed. Findings are bone on bone medial and patellofemoral with large medial osteophytes.          The drill is used  to create a starting hole in the distal femur and the canal is thoroughly irrigated with sterile saline to remove the fatty contents. The 5 degree right valgus alignment guide is placed into the femoral canal and the distal femoral cutting block is pinned to remove 11 mm off the distal femur. Resection is made with an oscillating saw.      The tibia is subluxed forward and the menisci are removed. The extramedullary alignment guide is placed referencing proximally at the medial aspect of the tibial tubercle and distally along the second metatarsal axis and tibial crest. The block is pinned to remove 2mm off the more deficient medial  side. Resection is made with an oscillating saw. Size 4 is the most appropriate size for the tibia and the proximal tibia is prepared with the modular drill and keel punch for that size.      The femoral sizing guide is placed and size 4 narrow is most appropriate. Rotation is marked off the epicondylar axis and confirmed by creating a rectangular flexion gap at 90 degrees. The size 4 cutting block is pinned in this rotation and the anterior, posterior  and chamfer cuts are made with the oscillating saw. The intercondylar block is then placed and that cut is made.      Trial size 4 tibial component, trial size 4 narrow posterior stabilized femur and a 10 mm posterior stabilized rotating platform insert trial is placed. Full extension is achieved with excellent varus/valgus and anterior/posterior balance throughout full range of motion. The patella is everted and thickness measured to be 26  mm. Free hand resection is taken to 14 mm, a 41 template is placed, lug holes are drilled, trial patella is placed, and it tracks normally. Osteophytes are removed off the posterior femur with the trial in place. All trials are removed and the cut bone surfaces prepared with pulsatile lavage. Cement is mixed and once ready for implantation, the size 4 tibial implant, size 4 narrow posterior  stabilized femoral component, and the size 41 patella are cemented in place and the patella is held with the clamp. The trial insert is placed and the knee held in full extension. All extruded cement is removed and once the cement is hard the permanent 10 mm posterior stabilized rotating platform insert is placed into the tibial tray.      The wound is copiously irrigated with saline solution and the extensor mechanism closed over a Autovac drain with #1 PDS suture. The tourniquet is released for a total tourniquet time of 34 minutes. Flexion against gravity is 140 degrees and the patella tracks normally. Subcutaneous tissue is closed with 2.0 vicryl and subcuticular with running 4.0 Monocryl. Both incisions are cleaned and dried and steri strips and a bulky sterile dressing is applied. Both legs are then placed into knee immobilizers and the patient is then awakened and transported to recovery in stable condition.      Please note that a surgical assistant was a medical necessity for this procedure in order to perform it in a safe and expeditious manner. Surgical assistant was necessary to retract the ligaments and vital neurovascular structures to prevent injury to them and also necessary for proper positioning of the limb to allow for anatomic placement of the prosthesis.   Gus Rankin Annmarie Plemmons, MD    08/04/2011, 5:59 PM

## 2011-08-04 NOTE — Progress Notes (Signed)
Booking card given to family

## 2011-08-05 ENCOUNTER — Encounter (HOSPITAL_COMMUNITY): Payer: Self-pay | Admitting: Anesthesiology

## 2011-08-05 LAB — BASIC METABOLIC PANEL
BUN: 12 mg/dL (ref 6–23)
GFR calc Af Amer: 83 mL/min — ABNORMAL LOW (ref >90)
GFR calc non Af Amer: 72 mL/min — ABNORMAL LOW (ref >90)
Potassium: 4.4 mEq/L (ref 3.5–5.1)
Sodium: 137 mEq/L (ref 135–145)

## 2011-08-05 LAB — CBC
MCHC: 33.8 g/dL (ref 30.0–36.0)
Platelets: 167 10*3/uL (ref 150–400)
RDW: 13.8 % (ref 11.5–15.5)

## 2011-08-05 LAB — PROTIME-INR
INR: 1.14 (ref 0.00–1.49)
Prothrombin Time: 14.8 seconds (ref 11.6–15.2)

## 2011-08-05 MED ORDER — WARFARIN SODIUM 3 MG PO TABS
3.0000 mg | ORAL_TABLET | Freq: Once | ORAL | Status: AC
Start: 2011-08-05 — End: 2011-08-05
  Administered 2011-08-05: 3 mg via ORAL
  Filled 2011-08-05: qty 1

## 2011-08-05 MED ORDER — PATIENT'S GUIDE TO USING COUMADIN BOOK
Freq: Once | Status: AC
  Administered 2011-08-05: 10:00:00
  Filled 2011-08-05: qty 1

## 2011-08-05 MED ORDER — WARFARIN VIDEO
Freq: Once | Status: AC
  Administered 2011-08-05: 10:00:00
  Filled 2011-08-05: qty 1

## 2011-08-05 NOTE — Plan of Care (Signed)
Problem: Phase II Progression Outcomes Goal: Progress activity as tolerated unless otherwise ordered Outcome: Progressing progressing

## 2011-08-05 NOTE — Progress Notes (Signed)
Patient afebrile, vital signs stable.  Adequate analgesia. Site clean, dry, and intact. Patients questions answered.  Plan continue current infusion Will discontinue per surgeons request 

## 2011-08-05 NOTE — Progress Notes (Signed)
Subjective: 1 Day Post-Op Procedure(s) (LRB): TOTAL KNEE BILATERAL (Bilateral) Patient reports pain as nothing. Epidural is working great..   Patient seen in rounds with Dr. Lequita Halt. Patient doing fantastic this morning.  Wife in room.  We will start therapy today. Plan is to go into SNF after hospital stay.  Get social worker involved. Anesthesia to run epidural for postop pain control. Plans to keep epidural until tomorrow.  Will start Coumadin tonight and then Lovenox injections tomorrow at least six hours after removal of the epidural catheter.  Objective: Vital signs in last 24 hours: Temp:  [97 F (36.1 C)-98.3 F (36.8 C)] 98.1 F (36.7 C) (11/15 0558) Pulse Rate:  [55-108] 87  (11/15 0558) Resp:  [10-18] 16  (11/15 0558) BP: (68-163)/(41-92) 93/61 mmHg (11/15 0558) SpO2:  [94 %-100 %] 94 % (11/15 0558) FiO2 (%):  [2 %] 2 % (11/14 2159) Weight:  [68.748 kg (151 lb 9 oz)] 151 lb 9 oz (68.748 kg) (11/14 2000)  Intake/Output from previous day:  Intake/Output Summary (Last 24 hours) at 08/05/11 0741 Last data filed at 08/05/11 0500  Gross per 24 hour  Intake 2558.33 ml  Output   1940 ml  Net 618.33 ml    Intake/Output this shift:    Labs: Results for orders placed during the hospital encounter of 08/04/11  TYPE AND SCREEN      Component Value Range   ABO/RH(D) A POS     Antibody Screen NEG     Sample Expiration 08/07/2011    ABO/RH      Component Value Range   ABO/RH(D) A POS    PROTIME-INR      Component Value Range   Prothrombin Time 14.8  11.6 - 15.2 (seconds)   INR 1.14  0.00 - 1.49   CBC      Component Value Range   WBC 6.9  4.0 - 10.5 (K/uL)   RBC 3.31 (*) 4.22 - 5.81 (MIL/uL)   Hemoglobin 10.3 (*) 13.0 - 17.0 (g/dL)   HCT 30.8 (*) 65.7 - 52.0 (%)   MCV 92.1  78.0 - 100.0 (fL)   MCH 31.1  26.0 - 34.0 (pg)   MCHC 33.8  30.0 - 36.0 (g/dL)   RDW 84.6  96.2 - 95.2 (%)   Platelets 167  150 - 400 (K/uL)  BASIC METABOLIC PANEL      Component Value Range     Sodium 137  135 - 145 (mEq/L)   Potassium 4.4  3.5 - 5.1 (mEq/L)   Chloride 102  96 - 112 (mEq/L)   CO2 29  19 - 32 (mEq/L)   Glucose, Bld 120 (*) 70 - 99 (mg/dL)   BUN 12  6 - 23 (mg/dL)   Creatinine, Ser 8.41  0.50 - 1.35 (mg/dL)   Calcium 8.7  8.4 - 32.4 (mg/dL)   GFR calc non Af Amer 72 (*) >90 (mL/min)   GFR calc Af Amer 83 (*) >90 (mL/min)    Exam - Neurovascular intact Sensation intact distally Intact pulses distally Dressing - clean, dry to both knee dressings. Motor function intact - moving foot and toes well on exam.  Hemovac pulled without difficulty.  Assessment/Plan: 1 Day Post-Op Procedure(s) (LRB): TOTAL KNEE BILATERAL (Bilateral)  Up with therapy Discharge to SNF when met goals and bed ready. Past Medical History  Diagnosis Date  . Elevated cholesterol   . Arthritis   . Hiatal hernia   . Enlarged prostate   . Glaucoma   . Hearing  loss     DVT Prophylaxis - Coumadin Protocol to start tonight and Lovenox to start tomorrow  Weight-Bearing as tolerated to both legs Keep foley until tomorrow and not remove until six hours after epidural removal. No vaccines.  [name], [name] 08/05/2011, 7:41 AM

## 2011-08-05 NOTE — Progress Notes (Signed)
Chart reviewed and UR completed. 

## 2011-08-05 NOTE — Plan of Care (Signed)
Problem: Consults Goal: Diagnosis- Total Joint Replacement Outcome: Progressing Primary Total Knee  (bilateral)  Problem: Phase I Progression Outcomes Goal: Initial discharge plan identified Outcome: Progressing SNF v. Home with HH.

## 2011-08-05 NOTE — Progress Notes (Signed)
Physical Therapy Treatment Patient Details Name: [name] [name] MRN: [id number] DOB: 07/28/1941 Today's Date: 08/05/2011 [id number]--[id number] PT Assessment/Plan  PT - Assessment/Plan Comments on Treatment Session: Pt able flex each knee to 40 in supine, pt has no pain at present,encouraged pt to not do too much ther ex tonight PT Plan: Discharge plan remains appropriate;Frequency remains appropriate PT Frequency: 7X/week Follow Up Recommendations: Skilled nursing facility Equipment Recommended: Defer to next venue PT Goals  Acute Rehab PT Goals PT Goal Formulation: With patient Time For Goal Achievement: 7 days Pt will go Supine/Side to Sit: with supervision;with HOB 0 degrees PT Goal: Supine/Side to Sit - Progress: Progressing toward goal Pt will go Sit to Supine/Side: with supervision;with HOB 0 degrees PT Goal: Sit to Supine/Side - Progress: Progressing toward goal Pt will Transfer Sit to Stand/Stand to Sit: with supervision PT Transfer Goal: Sit to Stand/Stand to Sit - Progress: Progressing toward goal Pt will Transfer Bed to Chair/Chair to Bed: with supervision PT Transfer Goal: Bed to Chair/Chair to Bed - Progress: Progressing toward goal Pt will Ambulate: >150 feet;with min assist PT Goal: Ambulate - Progress: Progressing toward goal Pt will Perform Home Exercise Program: with supervision, verbal cues required/provided PT Goal: Perform Home Exercise Program - Progress: Progressing toward goal  PT Treatment Precautions/Restrictions  Precautions Precautions: Knee Required Braces or Orthoses: Yes Knee Immobilizer: Discontinue once straight leg raise with < 10 degree lag (bilateral) Restrictions Weight Bearing Restrictions: No RLE Weight Bearing: Weight bearing as tolerated LLE Weight Bearing: Weight bearing as tolerated Mobility (including Balance) Bed Mobility Bed Mobility: Yes Sit to Supine - Right: 4: Min assist;HOB flat Sit to Supine - Right Details (indicate cue type and  reason): pt. lifted BilLEs onto bed with min asssit Transfers Transfers: Yes Sit to Stand: 1: +2 Total assist;From chair/3-in-1;With upper extremity assist;Patient percentage (comment) (pt=50 pt slightly impulsive) Stand to Sit: 1: +2 Total assist;To elevated surface;To bed;Patient percentage (comment) (pt=60) Stand to Sit Details: VC to walk legs forward Stand Pivot Transfers: 1: +2 Total assist Stand Pivot Transfer Details (indicate cue type and reason): pt=70 Ambulation/Gait Ambulation/Gait: No Stairs: No Wheelchair Mobility Wheelchair Mobility: No  Posture/Postural Control Posture/Postural Control: No significant limitations Exercise  Total Joint Exercises Ankle Circles/Pumps: AROM;Right;Left;10 reps;Supine Quad Sets: AROM;Right;Left;10 reps;Supine Short Arc Quad: AROM;Right;Left;10 reps;Supine Heel Slides: Both;10 reps;AAROM;Supine Straight Leg Raises: AROM;Both;10 reps;Supine End of Session PT - End of Session Equipment Utilized During Treatment: Right knee immobilizer;Left knee immobilizer Activity Tolerance: Patient tolerated treatment well Patient left: in bed;with call bell in reach Nurse Communication: Mobility status for transfers General Behavior During Session: [facility] for tasks performed Cognition: [facility] for tasks performed  [name] 08/05/2011, 3:48 PM

## 2011-08-05 NOTE — Progress Notes (Signed)
Physical Therapy Evaluation Patient Details Name: [name] [name] MRN: [id number] DOB: October 18, 1940 Today's Date: 08/05/2011 [id number] Ev2 Problem List:  Patient Active Problem List  Diagnoses  . Osteoarthritis bilateral knees    Past Medical History:  Past Medical History  Diagnosis Date  . Elevated cholesterol   . Arthritis   . Hiatal hernia   . Enlarged prostate   . Glaucoma   . Hearing loss    Past Surgical History:  Past Surgical History  Procedure Date  . Tonsillectomy   . Knee arthroscopy     bil    PT Assessment/Plan/Recommendation PT Assessment Clinical Impression Statement: pt tolerated very well with no pain R/LLE, has epidural. Pt can bbenefit from PT to improve ROM, strength, functional mobility to DC to Next venue PT Recommendation/Assessment: Patient will need skilled PT in the acute care venue PT Problem List: Decreased strength;Decreased range of motion;Decreased activity tolerance;Decreased mobility;Decreased knowledge of use of DME;Decreased safety awareness;Decreased knowledge of precautions;Pain PT Therapy Diagnosis : Difficulty walking PT Plan PT Frequency: 7X/week PT Treatment/Interventions: DME instruction;Gait training;Functional mobility training;Patient/family education PT Recommendation Recommendations for Other Services: OT consult Follow Up Recommendations: Skilled nursing facility Equipment Recommended: Defer to next venue PT Goals  Acute Rehab PT Goals PT Goal Formulation: With patient Time For Goal Achievement: 7 days Pt will go Supine/Side to Sit: with supervision;with HOB 0 degrees PT Goal: Supine/Side to Sit - Progress: Progressing toward goal Pt will go Sit to Supine/Side: with supervision;with HOB 0 degrees PT Goal: Sit to Supine/Side - Progress: Progressing toward goal Pt will Transfer Sit to Stand/Stand to Sit: with supervision PT Transfer Goal: Sit to Stand/Stand to Sit - Progress: Progressing toward goal Pt will Transfer Bed to  Chair/Chair to Bed: with supervision PT Transfer Goal: Bed to Chair/Chair to Bed - Progress: Progressing toward goal Pt will Ambulate: >150 feet;with min assist PT Goal: Ambulate - Progress: Progressing toward goal Pt will Perform Home Exercise Program: with supervision, verbal cues required/provided PT Goal: Perform Home Exercise Program - Progress: Progressing toward goal  PT Evaluation Precautions/Restrictions  Precautions Precautions: Knee Required Braces or Orthoses: Yes Knee Immobilizer: Discontinue once straight leg raise with < 10 degree lag Restrictions Weight Bearing Restrictions: No RLE Weight Bearing: Weight bearing as tolerated LLE Weight Bearing: Weight bearing as tolerated Prior Functioning  Home Living Lives With: Spouse Receives Help From: Family Type of Home: House Prior Function Level of Independence: Independent with basic ADLs;Independent with gait;Independent with transfers Able to Take Stairs?: Yes Driving: Yes Comments: pt plans SNF Cognition Cognition Arousal/Alertness: Awake/alert Overall Cognitive Status: Appears within functional limits for tasks assessed Orientation Level: Oriented X4 Sensation/Coordination Sensation Light Touch: Appears Intact Additional Comments: pt has epidural and reports senstion WNL Coordination Gross Motor Movements are Fluid and Coordinated: Yes (or UE, LEs NT) Extremity Assessment RUE Assessment RUE Assessment: Within Functional Limits LUE Assessment LUE Assessment: Within Functional Limits RLE Assessment RLE Assessment: Exceptions to [facility] RLE Strength RLE Overall Strength Comments: pt able to perform SLR LLE Assessment LLE Assessment: Exceptions to WFL LLE AROM (degrees) Overall AROM Left Lower Extremity:  (NT at this time) LLE Strength LLE Overall Strength Comments: Pt able to SLR, flexion NT Mobility (including Balance) Bed Mobility Bed Mobility: Yes Supine to Sit: 4: Min assist;HOB elevated (Comment  degrees) (40) Supine to Sit Details (indicate cue type and reason): pt able to move self on bed with vc Sitting - Scoot to Edge of Bed: 4: Min assist;With rail Transfers Transfers: Yes Sit to Stand:  1: +2 Total assist;From elevated surface;With upper extremity assist;From bed;Patient percentage (comment) (pt=50) Sit to Stand Details (indicate cue type and reason): placed pt. to near stand position Stand to Sit: 1: +2 Total assist;With upper extremity assist;To chair/3-in-1;Patient percentage (comment) (pt=60) Stand to Sit Details: vc to walk legs forward to sit Stand Pivot Transfers: 1: +2 Total assist (pt=60) Stand Pivot Transfer Details (indicate cue type and reason): VC for safety Ambulation/Gait Ambulation/Gait: No  Posture/Postural Control Posture/Postural Control: No significant limitations (pt had trunk control in standing) Exercise    End of Session PT - End of Session Equipment Utilized During Treatment: Right lower extremity prosthesis;Left lower extremity prosthesis Activity Tolerance: Patient tolerated treatment well Patient left: in chair;with call bell in reach Nurse Communication: Mobility status for transfers General Behavior During Session: [facility] for tasks performed Cognition: [facility] for tasks performed  [name] 08/05/2011, 2:50 PM

## 2011-08-05 NOTE — Progress Notes (Addendum)
ANTICOAGULATION CONSULT NOTE - Initial Consult  Pharmacy Consult for Coumadin Indication: VTE prophylaxis  No Known Allergies  Patient Measurements: Height: 5\' 7"  (170.2 cm) Weight: 151 lb 9 oz (68.748 kg) IBW/kg (Calculated) : 66.1   Vital Signs: Temp: 97.6 F (36.4 C) (11/14 2302) Temp src: Oral (11/14 2302) BP: 105/67 mmHg (11/14 2302) Pulse Rate: 74  (11/14 2302)  Labs: No results found for this basename: HGB:2,HCT:3,PLT:3,APTT:3,LABPROT:3,INR:3,HEPARINUNFRC:3,CREATININE:3,CKTOTAL:3,CKMB:3,TROPONINI:3 in the last 72 hours Estimated Creatinine Clearance: 67.6 ml/min (by C-G formula based on Cr of 0.95).  Medical History: Past Medical History  Diagnosis Date  . Elevated cholesterol   . Arthritis   . Hiatal hernia   . Enlarged prostate   . Glaucoma   . Hearing loss    Medications:  Prescriptions prior to admission  Medication Sig Dispense Refill  . atorvastatin (LIPITOR) 10 MG tablet Take 10 mg by mouth every evening.       . bimatoprost (LUMIGAN) 0.01 % SOLN Place 1 drop into both eyes at bedtime.       . brimonidine (ALPHAGAN) 0.2 % ophthalmic solution Place 1 drop into the left eye 2 (two) times daily.       . dorzolamide-timolol (COSOPT) 22.3-6.8 MG/ML ophthalmic solution Place 1 drop into both eyes 2 (two) times daily.        Marland Kitchen dutasteride (AVODART) 0.5 MG capsule Take 0.5 mg by mouth every evening.       Marland Kitchen aspirin EC 81 MG tablet Take 81 mg by mouth 3 (three) times a week. Monday, Wednesday, AND FRIDAY       . Calcium Carbonate-Vitamin D 600-125 MG-UNIT TABS Take 1 tablet by mouth daily.        . cholecalciferol (VITAMIN D) 1000 UNITS tablet Take 1,000 Units by mouth daily.        Marland Kitchen glucosamine-chondroitin 500-400 MG tablet Take 1 tablet by mouth every evening.        . Multiple Vitamins-Minerals (MULTIVITAMINS THER. W/MINERALS) TABS Take 1 tablet by mouth daily.        . niacin 100 MG tablet Take 100 mg by mouth daily.         Scheduled:    . acetaminophen   1,000 mg Intravenous Q6H  . bimatoprost  1 drop Both Eyes QHS  . brimonidine  1 drop Left Eye BID  . ceFAZolin (ANCEF) IV  1 g Intravenous Once  . ceFAZolin (ANCEF) IV  1 g Intravenous Q6H  . docusate sodium  100 mg Oral BID  . dorzolamide-timolol  1 drop Both Eyes BID  . dutasteride  0.5 mg Oral QPM  . HYDROmorphone      . morphine   Intravenous Q4H  . niacin  250 mg Oral QHS  . scopolamine  1 patch Transdermal Once  . simvastatin  20 mg Oral Daily  . DISCONTD: niacin  100 mg Oral Daily   Infusions:    . dextrose 5 % and 0.9% NaCl    . fentaNYL with bupivacaine epidural    . lactated ringers    . naloxone (NARCAN) infusion    . DISCONTD: bupivacaine 0.25 % ON-Q pump SINGLE CATH 300 mL    . DISCONTD: fentaNYL with bupivacaine epidural 10 mL/hr at 08/04/11 1848  . DISCONTD: lactated ringers     PRN: acetaminophen, acetaminophen, bisacodyl, bisacodyl, diphenhydrAMINE, diphenhydrAMINE, diphenhydrAMINE, diphenhydrAMINE, diphenhydrAMINE, diphenhydrAMINE, ibuprofen, magnesium hydroxide, menthol-cetylpyridinium, methocarbamol(ROBAXIN) IV, methocarbamol, metoCLOPramide (REGLAN) injection, metoCLOPramide (REGLAN) injection, metoCLOPramide, nalbuphine, nalbuphine, naloxone (NARCAN) infusion, naloxone, naloxone, ondansetron (ZOFRAN)  IV ondansetron (ZOFRAN) IV, ondansetron (ZOFRAN) IV, ondansetron, oxyCODONE, phenol, polyethylene glycol, sodium chloride, sodium chloride, sodium phosphate, temazepam, DISCONTD: dexamethasone, DISCONTD: HYDROmorphone, DISCONTD: ketorolac, DISCONTD: ketorolac, DISCONTD: ketorolac, DISCONTD: meperidine, DISCONTD: meperidine, DISCONTD: midazolam, DISCONTD: midazolam, DISCONTD: sodium chloride irrigation  Assessment: Status-post bilateral knee replacements. Epidural remains in place so Coumadin to start 08/05/11. Pre-op INR = 0.93 on 07/29/11.  Goal of Therapy:  INR 2-3   Plan:  Start with a low-dose while epidural remains.  F/u for dc of epidural. Coumadin 3mg  at  1800 08/05/11. Check PT/INR daily. Provide Coumadin education.  Reece Packer 08/05/2011,12:23 AM

## 2011-08-06 ENCOUNTER — Encounter (HOSPITAL_COMMUNITY): Payer: Self-pay | Admitting: Orthopedic Surgery

## 2011-08-06 LAB — CBC
HCT: 24.9 % — ABNORMAL LOW (ref 39.0–52.0)
MCV: 90.9 fL (ref 78.0–100.0)
RBC: 2.74 MIL/uL — ABNORMAL LOW (ref 4.22–5.81)
WBC: 7.4 10*3/uL (ref 4.0–10.5)

## 2011-08-06 LAB — BASIC METABOLIC PANEL
BUN: 8 mg/dL (ref 6–23)
CO2: 28 mEq/L (ref 19–32)
Chloride: 101 mEq/L (ref 96–112)
Creatinine, Ser: 1 mg/dL (ref 0.50–1.35)

## 2011-08-06 MED ORDER — WARFARIN SODIUM 6 MG PO TABS
6.0000 mg | ORAL_TABLET | Freq: Once | ORAL | Status: AC
  Administered 2011-08-06: 6 mg via ORAL
  Filled 2011-08-06: qty 1

## 2011-08-06 MED ORDER — POLYSACCHARIDE IRON 150 MG PO CAPS
150.0000 mg | ORAL_CAPSULE | Freq: Every day | ORAL | Status: DC
  Administered 2011-08-06 – 2011-08-09 (×4): 150 mg via ORAL
  Filled 2011-08-06 (×5): qty 1

## 2011-08-06 MED ORDER — ENOXAPARIN SODIUM 30 MG/0.3ML ~~LOC~~ SOLN
30.0000 mg | Freq: Two times a day (BID) | SUBCUTANEOUS | Status: DC
  Administered 2011-08-06 – 2011-08-09 (×6): 30 mg via SUBCUTANEOUS
  Filled 2011-08-06 (×9): qty 0.3

## 2011-08-06 MED ORDER — SIMVASTATIN 20 MG PO TABS
20.0000 mg | ORAL_TABLET | Freq: Every day | ORAL | Status: DC
  Administered 2011-08-07 – 2011-08-08 (×2): 20 mg via ORAL
  Filled 2011-08-06 (×5): qty 1

## 2011-08-06 NOTE — Progress Notes (Signed)
Epidural discontinued and removed intact. Insertion site clean. No complications noted.

## 2011-08-06 NOTE — Progress Notes (Signed)
CM referral received.  See Midas note in shadow chart. 

## 2011-08-06 NOTE — Progress Notes (Signed)
Physical Therapy Treatment Patient Details Name: [name] [name] MRN: [id number] DOB: 10/19/1940 Today's Date: 08/06/2011 1200-1230 2E PT Assessment/Plan  PT - Assessment/Plan Comments on Treatment Session: flex on right =25, on left=40, since epiduralmpulled, much increase in pain PT Plan: Discharge plan remains appropriate;Frequency remains appropriate PT Frequency: 7X/week Follow Up Recommendations: Skilled nursing facility Equipment Recommended: Defer to next venue PT Goals  Acute Rehab PT Goals PT Goal Formulation: With patient Time For Goal Achievement: 7 days Pt will go Supine/Side to Sit: with supervision;with HOB 0 degrees PT Goal: Supine/Side to Sit - Progress: Progressing toward goal Pt will go Sit to Supine/Side: with supervision;with HOB 0 degrees PT Goal: Sit to Supine/Side - Progress: Other (comment) (mobility not assessed at this time due to pain issues )  PT Treatment Precautions/Restrictions  Precautions Precautions: Knee Required Braces or Orthoses: Yes Knee Immobilizer: Discontinue once straight leg raise with < 10 degree lag Restrictions Weight Bearing Restrictions: No RLE Weight Bearing: Weight bearing as tolerated LLE Weight Bearing: Weight bearing as tolerated Mobility (including Balance) Bed Mobility Bed Mobility: No Transfers Transfers: No Ambulation/Gait Ambulation/Gait: No    Exercise  Total Joint Exercises Quad Sets: AROM;Right;Left;10 reps;Supine (PT REQUIRED TO REST DUE TO INCREASE OF PAIN) Heel Slides: AAROM;Right;Left;10 reps;Supine (USED SHEET TO ASSIST , right only about 30 degrees, left 40) Hip ABduction/ADduction: AAROM;Right;Left;10 reps;Supine Straight Leg Raises: AAROM;Right;Left;10 reps;Supine End of Session PT - End of Session Equipment Utilized During Treatment:  (sheet to assist with ther ex) Activity Tolerance: Patient limited by pain;Patient tolerated treatment well Patient left: in bed General Behavior During Session:  Doris Cheadle 08/06/2011, 4:48 PM

## 2011-08-06 NOTE — Progress Notes (Signed)
Physical Therapy Treatment Patient Details Name: [name] [name] MRN: [id number] DOB: Jul 18, 1941 Today's Date: 08/06/2011 1421-1500 g 2ta PT Assessment/Plan  PT - Assessment/Plan Comments on Treatment Session: pt had much less pain than at 900Am when determined pt had not had PO pain meds. spent extra time collaborating with RN to provide pt with meds  PT Plan: Discharge plan remains appropriate;Frequency remains appropriate PT Frequency: 7X/week Follow Up Recommendations: Skilled nursing facility Equipment Recommended: Defer to next venue PT Goals  Acute Rehab PT Goals PT Goal Formulation: With patient Time For Goal Achievement: 7 days Pt will go Supine/Side to Sit: with supervision;with HOB 0 degrees PT Goal: Supine/Side to Sit - Progress: Progressing toward goal Pt will go Sit to Supine/Side: with supervision;with HOB 0 degrees PT Goal: Sit to Supine/Side - Progress: Progressing toward goal Pt will Transfer Sit to Stand/Stand to Sit: with supervision PT Transfer Goal: Sit to Stand/Stand to Sit - Progress: Progressing toward goal Pt will Transfer Bed to Chair/Chair to Bed: with supervision PT Transfer Goal: Bed to Chair/Chair to Bed - Progress: Progressing toward goal Pt will Ambulate: >150 feet;with min assist PT Goal: Ambulate - Progress: Progressing toward goal Pt will Perform Home Exercise Program: with supervision, verbal cues required/provided PT Goal: Perform Home Exercise Program - Progress: Progressing toward goal  PT Treatment Precautions/Restrictions  Precautions Precautions: Knee Required Braces or Orthoses: Yes Knee Immobilizer: Discontinue once straight leg raise with < 10 degree lag Restrictions Weight Bearing Restrictions: No RLE Weight Bearing: Weight bearing as tolerated LLE Weight Bearing: Weight bearing as tolerated Mobility (including Balance) Bed Mobility Bed Mobility: Yes Supine to Sit: 1: +2 Total assist;Patient percentage (comment) (pt= 30) Supine  to Sit Details (indicate cue type and reason): pt moved legs with sheet , used rail Sitting - Scoot to Edge of Bed: 1: +2 Total assist;Patient percentage (comment) (pt=40) Transfers Transfers: Yes Sit to Stand: 1: +2 Total assist;From elevated surface;From bed;With upper extremity assist Sit to Stand Details (indicate cue type and reason): pt. assisted to stand up to rw as bed raised Stand to Sit: 1: +2 Total assist;With upper extremity assist;To chair/3-in-1 Stand to Sit Details: VC/TC to reach back to arms and to walk legs forward Stand Pivot Transfers:  (pt=60) Ambulation/Gait Ambulation/Gait: Yes Ambulation/Gait Assistance: 1: +2 Total assist;Patient percentage (comment) (pt= 50) Ambulation/Gait Assistance Details (indicate cue type and reason): pt able to bear weight on BLE, able to take small steps Ambulation Distance (Feet): 6 Feet Assistive device: Rolling walker Gait Pattern: Step-to pattern Gait velocity: slow    Exercise   End of Session PT - End of Session Equipment Utilized During Treatment: Right knee immobilizer;Left knee immobilizer Activity Tolerance: Patient limited by pain;Patient tolerated treatment well Patient left: in chair;with call bell in reach Nurse Communication: Mobility status for transfers General Behavior During Session: [facility] for tasks performed (but )  [name] 08/06/2011, 5:05 PM

## 2011-08-06 NOTE — Progress Notes (Signed)
Physical Therapy Treatment Patient Details Name: [name] [name] MRN: [id number] DOB: 05/06/1941 Today's Date: 08/06/2011 [id number] PT Assessment/Plan  PT - Assessment/Plan Comments on Treatment Session: pain increased this session. RN notified PT Plan: Discharge plan remains appropriate;Frequency remains appropriate PT Frequency: 7X/week Follow Up Recommendations: Skilled nursing facility Equipment Recommended: Defer to next venue PT Goals  Acute Rehab PT Goals PT Goal Formulation: With patient Time For Goal Achievement: 7 days Pt will go Supine/Side to Sit: with supervision;with HOB 0 degrees PT Goal: Supine/Side to Sit - Progress: Progressing toward goal Pt will go Sit to Supine/Side: with supervision;with HOB 0 degrees PT Goal: Sit to Supine/Side - Progress: Progressing toward goal Pt will Transfer Sit to Stand/Stand to Sit: with supervision PT Transfer Goal: Sit to Stand/Stand to Sit - Progress: Progressing toward goal Pt will Transfer Bed to Chair/Chair to Bed: with supervision PT Transfer Goal: Bed to Chair/Chair to Bed - Progress: Progressing toward goal Pt will Ambulate: >150 feet;with min assist PT Goal: Ambulate - Progress: Progressing toward goal Pt will Perform Home Exercise Program: with supervision, verbal cues required/provided PT Goal: Perform Home Exercise Program - Progress: Progressing toward goal  PT Treatment Precautions/Restrictions  Precautions Precautions: Knee Required Braces or Orthoses: Yes Knee Immobilizer: Discontinue once straight leg raise with < 10 degree lag Restrictions Weight Bearing Restrictions: No RLE Weight Bearing: Weight bearing as tolerated LLE Weight Bearing: Weight bearing as tolerated Mobility (including Balance) Bed Mobility Bed Mobility: Yes Supine to Sit: 1: +2 Total assist;Patient percentage (comment) (pt= 30) Supine to Sit Details (indicate cue type and reason): pt moved legs with sheet , used rail Sitting - Scoot to Edge  of Bed: 1: +2 Total assist;Patient percentage (comment) (pt=40) Sit to Supine - Right: 1: +2 Total assist;HOB flat Transfers Transfers: Yes Sit to Stand: 1: +2 Total assist;From chair/3-in-1;With armrests;Patient percentage (comment) (pt=40) Sit to Stand Details (indicate cue type and reason): vc to push off walk legs back Stand to Sit: 1: +2 Total assist;To elevated surface;To bed Stand to Sit Details: vc to reach back Stand Pivot Transfers: 1: +2 Total assist Ambulation/Gait Ambulation/Gait: No Ambulation/Gait Assistance: 1: +2 Total assist;Patient percentage (comment) (pt= 50) Ambulation/Gait Assistance Details (indicate cue type and reason): pt able to bear weight on BLE, able to take small steps Ambulation Distance (Feet): 6 Feet Assistive device: Rolling walker Gait Pattern: Step-to pattern Gait velocity: slow    Exercise  PT - End of Session Equipment Utilized During Treatment: Right knee immobilizer;Left knee immobilizer Activity Tolerance: Patient limited by pain;Patient tolerated treatment well Patient left: in bed;with call bell in reach;with family/visitor present Nurse Communication:  (wife expresses cocern for pt's lethargy) General Behavior During Session: Lethargic Cognition: Impaired (due to meds most likely, RN aware)  [name] 08/06/2011, 5:16 PM

## 2011-08-06 NOTE — Progress Notes (Signed)
Subjective: 2 Days Post-Op Procedure(s) (LRB): TOTAL KNEE BILATERAL (Bilateral) Patient reports pain as mild and a little more than yesterday.  Using pills and PCA. Patient seen in rounds with Dr. Lequita Halt. Patient has complaints of more soreness today.  Informed him that the epidural will come out today and to use the PCA but also take the pills for longer pain control. Plan is to go to [facility].  Will keep thru the weekend and will transfer over on Monday.  Objective: Vital signs in last 24 hours: Temp:  [98.6 F (37 C)-99.9 F (37.7 C)] 98.8 F (37.1 C) (11/16 0513) Pulse Rate:  [84-112] 91  (11/16 0513) Resp:  [15-18] 16  (11/16 0658) BP: (100-134)/(58-77) 132/73 mmHg (11/16 0513) SpO2:  [94 %-100 %] 99 % (11/16 0658)  Intake/Output from previous day:  Intake/Output Summary (Last 24 hours) at 08/06/11 0744 Last data filed at 08/06/11 0700  Gross per 24 hour  Intake   2914 ml  Output   3000 ml  Net    -86 ml    Intake/Output this shift:    Labs: Results for orders placed during the hospital encounter of 08/04/11  TYPE AND SCREEN      Component Value Range   ABO/RH(D) A POS     Antibody Screen NEG     Sample Expiration 08/07/2011    ABO/RH      Component Value Range   ABO/RH(D) A POS    PROTIME-INR      Component Value Range   Prothrombin Time 14.8  11.6 - 15.2 (seconds)   INR 1.14  0.00 - 1.49   CBC      Component Value Range   WBC 6.9  4.0 - 10.5 (K/uL)   RBC 3.31 (*) 4.22 - 5.81 (MIL/uL)   Hemoglobin 10.3 (*) 13.0 - 17.0 (g/dL)   HCT 09.8 (*) 11.9 - 52.0 (%)   MCV 92.1  78.0 - 100.0 (fL)   MCH 31.1  26.0 - 34.0 (pg)   MCHC 33.8  30.0 - 36.0 (g/dL)   RDW 14.7  82.9 - 56.2 (%)   Platelets 167  150 - 400 (K/uL)  BASIC METABOLIC PANEL      Component Value Range   Sodium 137  135 - 145 (mEq/L)   Potassium 4.4  3.5 - 5.1 (mEq/L)   Chloride 102  96 - 112 (mEq/L)   CO2 29  19 - 32 (mEq/L)   Glucose, Bld 120 (*) 70 - 99 (mg/dL)   BUN 12  6 - 23 (mg/dL)   Creatinine, Ser 1.30  0.50 - 1.35 (mg/dL)   Calcium 8.7  8.4 - 86.5 (mg/dL)   GFR calc non Af Amer 72 (*) >90 (mL/min)   GFR calc Af Amer 83 (*) >90 (mL/min)  PROTIME-INR      Component Value Range   Prothrombin Time 14.7  11.6 - 15.2 (seconds)   INR 1.13  0.00 - 1.49   CBC      Component Value Range   WBC 7.4  4.0 - 10.5 (K/uL)   RBC 2.74 (*) 4.22 - 5.81 (MIL/uL)   Hemoglobin 8.8 (*) 13.0 - 17.0 (g/dL)   HCT 78.4 (*) 69.6 - 52.0 (%)   MCV 90.9  78.0 - 100.0 (fL)   MCH 32.1  26.0 - 34.0 (pg)   MCHC 35.3  30.0 - 36.0 (g/dL)   RDW 29.5  28.4 - 13.2 (%)   Platelets 148 (*) 150 - 400 (K/uL)  BASIC METABOLIC  PANEL      Component Value Range   Sodium 135  135 - 145 (mEq/L)   Potassium 3.9  3.5 - 5.1 (mEq/L)   Chloride 101  96 - 112 (mEq/L)   CO2 28  19 - 32 (mEq/L)   Glucose, Bld 126 (*) 70 - 99 (mg/dL)   BUN 8  6 - 23 (mg/dL)   Creatinine, Ser 1.61  0.50 - 1.35 (mg/dL)   Calcium 8.5  8.4 - 09.6 (mg/dL)   GFR calc non Af Amer 74 (*) >90 (mL/min)   GFR calc Af Amer 86 (*) >90 (mL/min)    Exam - Neurovascular intact Sensation intact distally Intact pulses distally Dressing/Incision - clean, dry, no drainage to both incisions Motor function intact - moving foot and toes well on exam for both legs.  Assessment/Plan: 2 Days Post-Op Procedure(s) (LRB): TOTAL KNEE BILATERAL (Bilateral)  Advance diet Up with therapy Discharge to SNF on Monday to [facility] Past Medical History  Diagnosis Date  . Elevated cholesterol   . Arthritis   . Hiatal hernia   . Enlarged prostate   . Glaucoma   . Hearing loss     DVT Prophylaxis - Coumadin Protocol Weight-Bearing as tolerated to both leg Therapy Keep thru weekend Plan for [facility] on Monday Epidural out today per anesthesia.  Keep foley for at least six hours after the epidural removal.  Will start Lovenox also later this evening at least six hours after epidural removal. INR 1.13 today. Keep PCA today and will discontinue PCA  tomorrow - Saturday. Provide IV push backup after PCA comes off tomorrow.  [name], [name] 08/06/2011, 7:44 AM

## 2011-08-06 NOTE — Progress Notes (Signed)
ANTICOAGULATION CONSULT NOTE - Follow Up Consult  Pharmacy Consult for Warfarin Indication: VTE Prophylaxis after bilateral TKA 08/04/11  No Known Allergies  Patient Measurements: Height: 5\' 7"  (170.2 cm) Weight: 151 lb 9 oz (68.748 kg) IBW/kg (Calculated) : 66.1    Vital Signs: Temp: 98.8 F (37.1 C) (11/16 0513) Temp src: Oral (11/16 0513) BP: 132/73 mmHg (11/16 0513) Pulse Rate: 91  (11/16 0513)  Labs:  Basename 08/06/11 0414 08/05/11 0345  HGB 8.8* 10.3*  HCT 24.9* 30.5*  PLT 148* 167  APTT -- --  LABPROT 14.7 14.8  INR 1.13 1.14  HEPARINUNFRC -- --  CREATININE 1.00 1.03  CKTOTAL -- --  CKMB -- --  TROPONINI -- --   Estimated Creatinine Clearance: 64.3 ml/min (by C-G formula based on Cr of 1).   Medications:  Scheduled:    . acetaminophen  1,000 mg Intravenous Q6H  . bimatoprost  1 drop Both Eyes QHS  . brimonidine  1 drop Left Eye BID  . ceFAZolin (ANCEF) IV  1 g Intravenous Q6H  . docusate sodium  100 mg Oral BID  . dorzolamide-timolol  1 drop Both Eyes BID  . dutasteride  0.5 mg Oral QPM  . enoxaparin (LOVENOX) injection  30 mg Subcutaneous Q12H  . morphine   Intravenous Q4H  . niacin  250 mg Oral QHS  . polysaccharide iron  150 mg Oral Daily  . scopolamine  1 patch Transdermal Once  . simvastatin  20 mg Oral Daily  . warfarin  3 mg Oral ONCE-1800   Infusions:    . dextrose 5 % and 0.9% NaCl 20 mL/hr at 08/05/11 1318  . naloxone (NARCAN) infusion    . DISCONTD: fentaNYL with bupivacaine epidural 10 mL/hr at 08/06/11 0707   PRN: acetaminophen, acetaminophen, bisacodyl, bisacodyl, diphenhydrAMINE, diphenhydrAMINE, diphenhydrAMINE, diphenhydrAMINE, diphenhydrAMINE, diphenhydrAMINE, ibuprofen, magnesium hydroxide, menthol-cetylpyridinium, methocarbamol(ROBAXIN) IV, methocarbamol, metoCLOPramide (REGLAN) injection, metoCLOPramide (REGLAN) injection, metoCLOPramide, nalbuphine, nalbuphine, naloxone (NARCAN) infusion, naloxone, naloxone, ondansetron  (ZOFRAN) IV ondansetron (ZOFRAN) IV, ondansetron (ZOFRAN) IV, ondansetron, oxyCODONE, phenol, polyethylene glycol, sodium chloride, sodium chloride, sodium phosphate, temazepam  Inpatient warfarin doses this admission: 3mg  PO on 11/15. Epidural catheter was removed by anesthesiology this AM.    Assessment: -Warfarin initiation in progress; should be safe to increase dosage now that epidural is out. -Beginning prophylactic-dose LMWH bridging tonight  Goal of Therapy:  INR 2-3   Plan:  -Warfarin 6mg  PO today x 1. -Follow PT / INR daily. -Warfarin teaching. -Agree with prophylactic-dose LMWH (30mg  SQ q12h starting at 8pm).   Elie Goody, Pharm.D.  [phone] 08/06/2011 10:45 AM

## 2011-08-06 NOTE — Progress Notes (Signed)
Addendum:  Counseled patient regarding precautions to follow while on warfarin and left copy of information sheet.  He expressed understanding.  Elie Goody, Pharm.D.  [phone] 08/06/2011 11:20 AM

## 2011-08-06 NOTE — Progress Notes (Signed)
Met with patient and his wife- SNF bed secured at [facility] for him at d/c which appears to be Monday. CSW Psychosocial Assessment and FL2 placed in shadow chart in wallaroo. Reece Levy, MSW, Theresia Majors [phone]

## 2011-08-06 NOTE — Progress Notes (Signed)
Occupational Therapy Note Patient with epidural catheter. Will hold on OT eval until it is removed. Will check back as able. Judithann Sauger OTR/L [phone]

## 2011-08-07 LAB — CBC
Hemoglobin: 8.8 g/dL — ABNORMAL LOW (ref 13.0–17.0)
MCH: 31.2 pg (ref 26.0–34.0)
MCV: 91.1 fL (ref 78.0–100.0)
RBC: 2.82 MIL/uL — ABNORMAL LOW (ref 4.22–5.81)
WBC: 11.7 10*3/uL — ABNORMAL HIGH (ref 4.0–10.5)

## 2011-08-07 LAB — PROTIME-INR: Prothrombin Time: 16 seconds — ABNORMAL HIGH (ref 11.6–15.2)

## 2011-08-07 LAB — BASIC METABOLIC PANEL
CO2: 26 mEq/L (ref 19–32)
Calcium: 9.2 mg/dL (ref 8.4–10.5)
Chloride: 96 mEq/L (ref 96–112)
Glucose, Bld: 147 mg/dL — ABNORMAL HIGH (ref 70–99)
Sodium: 131 mEq/L — ABNORMAL LOW (ref 135–145)

## 2011-08-07 MED ORDER — ALUM & MAG HYDROXIDE-SIMETH 200-200-20 MG/5ML PO SUSP
30.0000 mL | ORAL | Status: DC | PRN
  Administered 2011-08-07: 30 mL via ORAL
  Filled 2011-08-07: qty 30

## 2011-08-07 MED ORDER — WARFARIN SODIUM 6 MG PO TABS
6.0000 mg | ORAL_TABLET | Freq: Once | ORAL | Status: AC
  Administered 2011-08-07: 6 mg via ORAL
  Filled 2011-08-07: qty 1

## 2011-08-07 NOTE — Progress Notes (Signed)
Afebrile. Comfortable. Hgb stable @ 8.8

## 2011-08-07 NOTE — Plan of Care (Signed)
Problem: Consults Goal: Diagnosis- Total Joint Replacement Outcome: Completed/Met Date Met:  08/07/11 Primary Total Knee Bilateral

## 2011-08-07 NOTE — Progress Notes (Signed)
Physical Therapy Treatment Patient Details Name: [name] [name] MRN: [id number] DOB: August 24, 1941 Today's Date: 08/07/2011 1478-2956 ta 2g PT Assessment/Plan  PT - Assessment/Plan Comments on Treatment Session: pt tolerated very well after premedicatwed with po. MEDS PT Plan: Discharge plan remains appropriate;Frequency remains appropriate PT Frequency: 7X/week Follow Up Recommendations: Skilled nursing facility Equipment Recommended: Defer to next venue PT Goals  Acute Rehab PT Goals PT Goal Formulation: With patient Time For Goal Achievement: 7 days Pt will go Supine/Side to Sit: with supervision;with HOB 0 degrees PT Goal: Supine/Side to Sit - Progress: Progressing toward goal Pt will Transfer Sit to Stand/Stand to Sit: with supervision PT Transfer Goal: Sit to Stand/Stand to Sit - Progress: Progressing toward goal Pt will Ambulate: >150 feet;with min assist PT Goal: Ambulate - Progress: Progressing toward goal Pt will Perform Home Exercise Program: with supervision, verbal cues required/provided PT Goal: Perform Home Exercise Program - Progress: Progressing toward goal  PT Treatment Precautions/Restrictions  Precautions Precautions: Knee Required Braces or Orthoses: Yes Knee Immobilizer: Discontinue once straight leg raise with < 10 degree lag Restrictions Weight Bearing Restrictions: No RLE Weight Bearing: Weight bearing as tolerated LLE Weight Bearing: Weight bearing as tolerated Mobility (including Balance) Bed Mobility Bed Mobility: Yes Supine to Sit: HOB elevated (Comment degrees);3: Mod assist (45) Supine to Sit Details (indicate cue type and reason): pt able to slide each leg toward edge, using trapeze Sitting - Scoot to Edge of Bed: 4: Min assist Transfers Transfers: Yes Sit to Stand: 1: +2 Total assist;From bed;From elevated surface Sit to Stand Details (indicate cue type and reason): pt assited to lurch foerward to Stand over RW Stand to Sit: 3: Mod assist;With  armrests;With upper extremity assist;To chair/3-in-1 Stand to Sit Details: VC to walk legs forward as pt. sat down Ambulation/Gait Ambulation/Gait: Yes Ambulation/Gait Assistance: 1: +2 Total assist (pt= 70) Ambulation/Gait Assistance Details (indicate cue type and reason): Vc to reciprocate gait vs. swing through Ambulation Distance (Feet): 130 Feet Assistive device: Rolling walker Gait Pattern: Step-through pattern Gait velocity: slow and steady    Exercise    End of Session PT - End of Session Equipment Utilized During Treatment: Right knee immobilizer;Left knee immobilizer Activity Tolerance: Patient tolerated treatment well (much improved with PO meds) Patient left: in chair;with family/visitor present Nurse Communication: Mobility status for ambulation General Behavior During Session: [facility] for tasks performed Cognition: [facility] for tasks performed  Blanchard Kelch [phone]/17/2012, 1:52 PM

## 2011-08-07 NOTE — Progress Notes (Signed)
ANTICOAGULATION CONSULT NOTE - Follow Up Consult  Pharmacy Consult for Warfarin Indication: VTE Prophylaxis after bilateral TKA 08/04/11  No Known Allergies  Patient Measurements: Height: 5\' 7"  (170.2 cm) Weight: 151 lb 9 oz (68.748 kg) IBW/kg (Calculated) : 66.1    Vital Signs: Temp: 98.2 F (36.8 C) (11/17 0500) Temp src: Oral (11/17 0500) BP: 137/78 mmHg (11/17 0500) Pulse Rate: 107  (11/17 0500)  Labs:  Basename 08/07/11 0432 08/06/11 0414 08/05/11 0345  HGB 8.8* 8.8* --  HCT 25.7* 24.9* 30.5*  PLT 195 148* 167  APTT -- -- --  LABPROT 16.0* 14.7 14.8  INR 1.25 1.13 1.14  HEPARINUNFRC -- -- --  CREATININE 1.10 1.00 1.03  CKTOTAL -- -- --  CKMB -- -- --  TROPONINI -- -- --   Estimated Creatinine Clearance: 58.4 ml/min (by C-G formula based on Cr of 1.1).   Medications:  Scheduled:     . bimatoprost  1 drop Both Eyes QHS  . brimonidine  1 drop Left Eye BID  . docusate sodium  100 mg Oral BID  . dorzolamide-timolol  1 drop Both Eyes BID  . dutasteride  0.5 mg Oral QPM  . enoxaparin (LOVENOX) injection  30 mg Subcutaneous Q12H  . morphine   Intravenous Q4H  . niacin  250 mg Oral QHS  . polysaccharide iron  150 mg Oral Daily  . scopolamine  1 patch Transdermal Once  . simvastatin  20 mg Oral q1800  . warfarin  6 mg Oral ONCE-1800  . DISCONTD: simvastatin  20 mg Oral Daily   Infusions:     . dextrose 5 % and 0.9% NaCl 20 mL/hr at 08/07/11 0219  . naloxone [facility]) infusion Stopped (08/06/11 0815)   PRN: acetaminophen, acetaminophen, bisacodyl, bisacodyl, diphenhydrAMINE, diphenhydrAMINE, diphenhydrAMINE, diphenhydrAMINE, diphenhydrAMINE, diphenhydrAMINE, ibuprofen, magnesium hydroxide, menthol-cetylpyridinium, methocarbamol(ROBAXIN) IV, methocarbamol, metoCLOPramide (REGLAN) injection, metoCLOPramide (REGLAN) injection, metoCLOPramide, naloxone (NARCAN) infusion, naloxone, naloxone, ondansetron (ZOFRAN) IV, ondansetron (ZOFRAN) IV ondansetron (ZOFRAN) IV,  ondansetron, oxyCODONE, phenol, polyethylene glycol, sodium chloride, sodium chloride, sodium phosphate, temazepam  Inpatient warfarin doses this admission: 3mg  11/15, 6mg  11/16   Assessment: -Warfarin initiation in progress along with prophylactic-dose LMWH bridging - INR subtherapeutic, but trending up  Goal of Therapy:  INR 2-3   Plan:  -Warfarin 6mg  PO today x 1.  - Continue Lovenox prophylaxis. -Follow PT / INR daily.   Lynann Beaver PharmD  Pager [phone] 08/07/2011 2:24 PM

## 2011-08-07 NOTE — Progress Notes (Signed)
Physical Therapy Treatment Patient Details Name: SHAYNE DIGUGLIELMO MRN: [id number] DOB: Oct 13, 1940 Today's Date: 08/07/2011 15:06-15:38, gt, te PT Assessment/Plan  PT - Assessment/Plan Comments on Treatment Session: Pt fatigued from AM session, but agreeable to ambulate short distance back to bed for exercises. PT Plan: Discharge plan remains appropriate;Frequency remains appropriate PT Frequency: 7X/week Follow Up Recommendations: Skilled nursing facility Equipment Recommended: Defer to next venue PT Goals  Acute Rehab PT Goals PT Goal Formulation: With patient Time For Goal Achievement: 7 days  PT Goal: Sit to Supine/Side - Progress: Progressing toward goal Pt will Transfer Sit to Stand/Stand to Sit: with supervision PT Transfer Goal: Sit to Stand/Stand to Sit - Progress: Progressing toward goal PT Transfer Goal: Bed to Chair/Chair to Bed - Progress: Progressing toward goal Pt will Ambulate: >150 feet;with min assist PT Goal: Ambulate - Progress: Progressing toward goal Pt will Perform Home Exercise Program: with supervision, verbal cues required/provided PT Goal: Perform Home Exercise Program - Progress: Progressing toward goal  PT Treatment Precautions/Restrictions  Precautions Precautions: Knee Required Braces or Orthoses: Yes Knee Immobilizer: Discontinue once straight leg raise with < 10 degree lag Restrictions Weight Bearing Restrictions: No RLE Weight Bearing: Weight bearing as tolerated LLE Weight Bearing: Weight bearing as tolerated Mobility (including Balance) Bed Mobility Bed Mobility: Yes  Sitting - Scoot to Edge of Bed: 4: Min assist Sit to Supine - Right: 2: Max assist Sit to Supine - Right Details (indicate cue type and reason): A to lift B LE onto bed Transfers Transfers: Yes Sit to Stand: 1: +2 Total assist Sit to Stand Details (indicate cue type and reason): pt 75%, A to get upper body over BOS. Stand to Sit: 1: +2 Total assist Stand to Sit Details: pt  70%,  A to bring LE forward L >R Ambulation/Gait Ambulation/Gait: Yes Ambulation/Gait Assistance: 1: +2 Total assist Ambulation/Gait Assistance Details (indicate cue type and reason): pt 75% Ambulation Distance (Feet): 30 Feet Assistive device: Rolling walker Gait Pattern: Step-through pattern (cues for RW placement) Gait velocity: slow and steady    Exercise  Total Joint Exercises Ankle Circles/Pumps: AROM;Both;10 reps;Supine Quad Sets: Strengthening;Both;10 reps;Supine Heel Slides: AAROM;Both;10 reps;Supine Straight Leg Raises: AAROM;Strengthening;Both;10 reps End of Session PT - End of Session Equipment Utilized During Treatment: Gait belt Activity Tolerance: Patient tolerated treatment well Patient left: in bed;with family/visitor present Nurse Communication: Mobility status for ambulation General Behavior During Session: [facility] for tasks performed Cognition: [facility] for tasks performed  [facility] LUBECK 08/07/2011, 3:51 PM

## 2011-08-07 NOTE — Progress Notes (Signed)
Occupational Therapy Evaluation Patient Details Name: [name] [name] MRN: [id number] DOB: 05-05-1941 Today's Date: 08/07/2011  Problem List:  Patient Active Problem List  Diagnoses  . Osteoarthritis bilateral knees    Past Medical History:  Past Medical History  Diagnosis Date  . Elevated cholesterol   . Arthritis   . Hiatal hernia   . Enlarged prostate   . Glaucoma   . Hearing loss    Past Surgical History:  Past Surgical History  Procedure Date  . Tonsillectomy   . Knee arthroscopy     bil  . Total knee arthroplasty 08/04/2011    Procedure: TOTAL KNEE BILATERAL;  Surgeon: Loanne Drilling;  Location: [facility];  Service: Orthopedics;  Laterality: Bilateral;  epidural done in holding area without incident    OT Assessment/Plan/Recommendation OT Assessment Clinical Impression Statement: This 70 y.o. male presents to OT s/p bil. TKA.  Pt. demonstrates decreased functional mobility, and pain resulting in decreased independence with BADLs.  Recommend OT to maximize safety and independence with BADLs to allow pt. to return home with wife and Modified independent after SNF level rehab OT Recommendation/Assessment: Patient will need skilled OT in the acute care venue OT Problem List: Decreased strength;Decreased knowledge of use of DME or AE;Pain Barriers to Discharge: Decreased caregiver support (Will need SNF) OT Therapy Diagnosis : Generalized weakness;Acute pain OT Plan OT Frequency: Min 1X/week OT Treatment/Interventions: Self-care/ADL training;Therapeutic exercise;DME and/or AE instruction;Therapeutic activities;Patient/family education OT Recommendation Recommendations for Other Services: PT consult Follow Up Recommendations: Skilled nursing facility Equipment Recommended: Defer to next venue Individuals Consulted Consulted and Agree with Results and Recommendations: Patient OT Goals Acute Rehab OT Goals OT Goal Formulation: With patient Time For Goal Achievement: 7  days ADL Goals Pt Will Perform Grooming: with min assist;Standing at sink (mn guard A) Pt Will Perform Lower Body Bathing: with mod assist;Sit to stand from bed;Sit to stand from chair;with adaptive equipment Pt Will Transfer to Toilet: with min assist;Ambulation;Raised toilet seat with arms (min guard A) Pt Will Perform Toileting - Clothing Manipulation: with min assist;Standing (min guard) Pt Will Perform Toileting - Hygiene: with min assist;Standing at 3-in-1/toilet (min guard A)  OT Evaluation Precautions/Restrictions  Precautions Precautions: Knee Required Braces or Orthoses: Yes Knee Immobilizer: Discontinue once straight leg raise with < 10 degree lag Restrictions Weight Bearing Restrictions: No RLE Weight Bearing: Weight bearing as tolerated LLE Weight Bearing: Weight bearing as tolerated Prior Functioning Home Living Lives With: Spouse Additional Comments: Pt. for D/C to SNF for rehab Prior Function Level of Independence: Independent with basic ADLs;Independent with homemaking with ambulation ADL ADL Eating/Feeding: Simulated;Independent Where Assessed - Eating/Feeding: Chair Grooming: Simulated;Wash/dry hands;Wash/dry face;Teeth care;Set up Where Assessed - Grooming: Sitting, chair Upper Body Bathing: Simulated;Set up Where Assessed - Upper Body Bathing: Sitting, chair Lower Body Bathing: Simulated;+1 Total assistance Where Assessed - Lower Body Bathing: Sit to stand from chair;Sit to stand from bed Upper Body Dressing: Simulated;Minimal assistance Where Assessed - Upper Body Dressing: Sitting, bed;Unsupported Lower Body Dressing: Simulated;+1 Total assistance Where Assessed - Lower Body Dressing: Sit to stand from chair;Sit to stand from bed Toilet Transfer: Simulated;+2 Total assistance;Other (comment) Toilet Transfer Details (indicate cue type and reason): Pt. ~70%` Toilet Transfer Method: Ambulating Toilet Transfer Equipment: Raised toilet seat with arms (or  3-in-1 over toilet) Toileting - Clothing Manipulation: Simulated;Maximal assistance Where Assessed - Glass blower/designer Manipulation: Standing Toileting - Hygiene: Simulated;Maximal assistance Where Assessed - Toileting Hygiene: Standing Tub/Shower Transfer: Not assessed Equipment Used: Rolling walker;Leg lifter (Over bed  trapeze) ADL Comments: Pt. is very motivated. Total A to don KI bil. LE's.  requires cues for proper technique for sit to stand.  Currently, pt. only able to reach to ~knees Vision/Perception    Cognition Cognition Arousal/Alertness: Awake/alert Overall Cognitive Status: Appears within functional limits for tasks assessed Orientation Level: Oriented X4 Sensation/Coordination Coordination Gross Motor Movements are Fluid and Coordinated: Yes Fine Motor Movements are Fluid and Coordinated: Yes Extremity Assessment RUE Assessment RUE Assessment: Within Functional Limits LUE Assessment LUE Assessment: Within Functional Limits Mobility  Bed Mobility Bed Mobility: Yes Supine to Sit: HOB elevated (Comment degrees);3: Mod assist (45) Supine to Sit Details (indicate cue type and reason): pt able to slide each leg toward edge, using trapeze Sitting - Scoot to Edge of Bed: 4: Min assist Transfers Sit to Stand: 1: +2 Total assist;From bed;From elevated surface Sit to Stand Details (indicate cue type and reason): pt assited to lurch foerward to Stand over RW Stand to Sit: 3: Mod assist;With armrests;With upper extremity assist;To chair/3-in-1 Stand to Sit Details: VC to walk legs forward as pt. sat down Exercises   End of Session OT - End of Session Equipment Utilized During Treatment: Right knee immobilizer;Left knee immobilizer (RW; Gait belt) Activity Tolerance: Patient tolerated treatment well Patient left: in chair;with call bell in reach;with family/visitor present Nurse Communication: Mobility status for ambulation General Behavior During Session: [facility] for tasks  performed Cognition: [facility] for tasks performed   Thomasenia Dowse M 08/07/2011, 2:48 PM

## 2011-08-08 LAB — CBC
HCT: 20.1 % — ABNORMAL LOW (ref 39.0–52.0)
MCH: 31.6 pg (ref 26.0–34.0)
MCV: 88.2 fL (ref 78.0–100.0)
Platelets: 196 10*3/uL (ref 150–400)
RBC: 2.28 MIL/uL — ABNORMAL LOW (ref 4.22–5.81)

## 2011-08-08 MED ORDER — WARFARIN SODIUM 6 MG PO TABS
6.0000 mg | ORAL_TABLET | Freq: Once | ORAL | Status: AC
  Administered 2011-08-08: 6 mg via ORAL
  Filled 2011-08-08: qty 1

## 2011-08-08 NOTE — Progress Notes (Signed)
Physical Therapy Treatment Patient Details Name: BRAXTIN BAMBA MRN: [id number] DOB: 1940-11-26 Today's Date: 08/08/2011 1503 - 1535, 2GT PT Assessment/Plan  PT - Assessment/Plan PT Plan: Discharge plan remains appropriate PT Frequency: 7X/week Follow Up Recommendations: Skilled nursing facility PT Goals  Acute Rehab PT Goals PT Goal: Supine/Side to Sit - Progress: Progressing toward goal PT Goal: Sit to Supine/Side - Progress: Progressing toward goal PT Transfer Goal: Sit to Stand/Stand to Sit - Progress: Progressing toward goal PT Transfer Goal: Bed to Chair/Chair to Bed - Progress: Progressing toward goal PT Goal: Ambulate - Progress: Progressing toward goal  PT Treatment Precautions/Restrictions  Precautions Precautions: Knee Required Braces or Orthoses: Yes Knee Immobilizer:  (R KI in place; L KI not used) Restrictions Weight Bearing Restrictions: No RLE Weight Bearing: Weight bearing as tolerated LLE Weight Bearing: Weight bearing as tolerated Mobility (including Balance) Bed Mobility Sit to Supine - Right: 3: Mod assist Sit to Supine - Right Details (indicate cue type and reason): Bil LE lift Transfers Sit to Stand: 4: Min assist;3: Mod assist Sit to Stand Details (indicate cue type and reason): cues for LE management Stand to Sit: 4: Min assist;3: Mod assist Ambulation/Gait Ambulation/Gait Assistance: 4: Min assist;3: Mod assist Ambulation/Gait Assistance Details (indicate cue type and reason): cues for posture and position from RW - progressed to recip gait Ambulation Distance (Feet):  (2x200') Assistive device: Rolling walker Gait Pattern: Step-to pattern;Step-through pattern    Exercise    End of Session PT - End of Session Activity Tolerance: Patient tolerated treatment well Patient left: in bed;with call bell in reach General Behavior During Session: [facility] for tasks performed Cognition: [facility] for tasks performed  Kevon Tench 08/08/2011, 3:52 PM

## 2011-08-08 NOTE — Progress Notes (Signed)
Subjective: 4 Days Post-Op Procedure(s): TOTAL KNEE BILATERAL  Patient reports pain as mild. Pain is well controlled with medication.  Objective:   VITALS:   Filed Vitals:   08/08/11 0543  BP: 113/55  Pulse: 91  Temp: 99.4 F (37.4 C)  Resp: 16    Neurovascular intact Dorsiflexion/Plantar flexion intact Incision: dressing C/D/I No cellulitis present Compartment soft  LABS  Basename 08/08/11 0535 08/07/11 0432 08/06/11 0414  HGB 7.2* 8.8* 8.8*  HCT 20.1* 25.7* 24.9*  WBC 8.1 11.7* 7.4  PLT 196 195 148*     Basename 08/07/11 0432 08/06/11 0414  NA 131* 135  K 4.0 3.9  BUN 9 8  CREATININE 1.10 1.00  GLUCOSE 147* 126*     Basename 08/07/11 0432 08/06/11 0414  LABPT -- --  INR 1.25 1.13     Assessment/Plan: 4 Days Post-Op Procedure(s): TOTAL KNEE BILATERAL   Up with therapy Plan for discharge tomorrow Discharge to [facility] Monday   Anastasio Auerbach. Honestii Marton   PAC  08/08/2011, 7:35 AM

## 2011-08-08 NOTE — Progress Notes (Signed)
Physical Therapy Treatment Patient Details Name: [name] [name] MRN: [id number] DOB: 1941/01/12 Today's Date: 08/08/2011 1130 - 1204, 2GT PT Assessment/Plan  PT - Assessment/Plan Comments on Treatment Session: OOB deferred at pt request for break after ther ex PT Plan: Discharge plan remains appropriate PT Frequency: 7X/week Follow Up Recommendations: Skilled nursing facility PT Goals  Acute Rehab PT Goals PT Goal: Supine/Side to Sit - Progress: Progressing toward goal PT Goal: Sit to Supine/Side - Progress: Progressing toward goal PT Transfer Goal: Sit to Stand/Stand to Sit - Progress: Progressing toward goal PT Transfer Goal: Bed to Chair/Chair to Bed - Progress: Progressing toward goal PT Goal: Ambulate - Progress: Progressing toward goal  PT Treatment Precautions/Restrictions  Precautions Precautions: Knee Required Braces or Orthoses: Yes Knee Immobilizer: On when out of bed or walking;Discontinue once straight leg raise with < 10 degree lag Restrictions Weight Bearing Restrictions: No RLE Weight Bearing: Weight bearing as tolerated LLE Weight Bearing: Weight bearing as tolerated Mobility (including Balance) Bed Mobility Supine to Sit: 1: +2 Total assist Supine to Sit Details (indicate cue type and reason): pt 60% Sitting - Scoot to Edge of Bed: 4: Min assist Transfers Sit to Stand: 1: +2 Total assist;From bed;With upper extremity assist Sit to Stand Details (indicate cue type and reason): cues for use of LEs and positioning of LEs Stand to Sit: 1: +2 Total assist;With armrests;With upper extremity assist;To chair/3-in-1 Stand to Sit Details: pt 75% with cues Ambulation/Gait Ambulation/Gait Assistance: 1: +2 Total assist (Pt 80%) Ambulation/Gait Assistance Details (indicate cue type and reason): Pt 80% with cues for sequence, posture, and postion from RW Ambulation Distance (Feet):  (2x120') Assistive device: Rolling walker Gait Pattern: Step-to pattern;Step-through  pattern    Exercise  Total Joint Exercises Ankle Circles/Pumps: AROM;20 reps;Supine;Both Quad Sets: AROM;Both;20 reps;Supine Heel Slides: AROM;Both;20 reps;Supine Straight Leg Raises: AAROM;AROM;20 reps;Both;Supine (Ind SLR on L) End of Session PT - End of Session Activity Tolerance: Patient tolerated treatment well Patient left: Other (comment) Nurse Communication:  (in bathroom) General Behavior During Session: [facility] for tasks performed Cognition: [facility] for tasks performed  Callen Vancuren 08/08/2011, 1:01 PM

## 2011-08-08 NOTE — Progress Notes (Signed)
Physical Therapy Treatment Patient Details Name: [name] [name] MRN: [id number] DOB: Dec 17, 1940 Today's Date: 08/08/2011 4098 - 1191  2TE PT Assessment/Plan  PT - Assessment/Plan Comments on Treatment Session: OOB deferred at pt request for break after ther ex PT Plan: Discharge plan remains appropriate PT Frequency: 7X/week Follow Up Recommendations: Skilled nursing facility PT Goals     PT Treatment Precautions/Restrictions  Precautions Precautions: Knee Required Braces or Orthoses: Yes Knee Immobilizer: On when out of bed or walking;Discontinue once straight leg raise with < 10 degree lag (Utilized KI R knee, No KI L knee) Restrictions Weight Bearing Restrictions: No RLE Weight Bearing: Weight bearing as tolerated LLE Weight Bearing: Weight bearing as tolerated Mobility (including Balance)      Exercise  Total Joint Exercises Ankle Circles/Pumps: AROM;20 reps;Supine;Both Quad Sets: AROM;Both;20 reps;Supine Heel Slides: AROM;Both;20 reps;Supine Straight Leg Raises: AAROM;AROM;20 reps;Both;Supine (Ind SLR on L) End of Session PT - End of Session Activity Tolerance: Patient tolerated treatment well Patient left: in bed General Behavior During Session: [facility] for tasks performed Cognition: [facility] for tasks performed  [name] [name] 08/08/2011, 12:50 PM

## 2011-08-08 NOTE — Progress Notes (Signed)
ANTICOAGULATION CONSULT NOTE - Follow Up Consult  Pharmacy Consult for Warfarin Indication: VTE Prophylaxis after bilateral TKA 08/04/11  No Known Allergies  Patient Measurements: Height: 5\' 7"  (170.2 cm) Weight: 151 lb 9 oz (68.748 kg) IBW/kg (Calculated) : 66.1    Vital Signs: Temp: 99.4 F (37.4 C) (11/18 0543) Temp src: Oral (11/18 0543) BP: 113/55 mmHg (11/18 0543) Pulse Rate: 91  (11/18 0543)  Labs:  Basename 08/08/11 0900 08/08/11 0535 08/07/11 0432 08/06/11 0414  HGB -- 7.2* 8.8* --  HCT -- 20.1* 25.7* 24.9*  PLT -- 196 195 148*  APTT -- -- -- --  LABPROT 19.5* -- 16.0* 14.7  INR 1.62* -- 1.25 1.13  HEPARINUNFRC -- -- -- --  CREATININE -- -- 1.10 1.00  CKTOTAL -- -- -- --  CKMB -- -- -- --  TROPONINI -- -- -- --   Estimated Creatinine Clearance: 58.4 ml/min (by C-G formula based on Cr of 1.1).   Medications:  Scheduled:     . bimatoprost  1 drop Both Eyes QHS  . brimonidine  1 drop Left Eye BID  . docusate sodium  100 mg Oral BID  . dorzolamide-timolol  1 drop Both Eyes BID  . dutasteride  0.5 mg Oral QPM  . enoxaparin (LOVENOX) injection  30 mg Subcutaneous Q12H  . morphine   Intravenous Q4H  . niacin  250 mg Oral QHS  . polysaccharide iron  150 mg Oral Daily  . scopolamine  1 patch Transdermal Once  . simvastatin  20 mg Oral q1800  . warfarin  6 mg Oral ONCE-1800   Infusions:     . dextrose 5 % and 0.9% NaCl 20 mL/hr at 08/07/11 0219  . naloxone [facility]) infusion Stopped (08/06/11 0815)   PRN: acetaminophen, acetaminophen, alum & mag hydroxide-simeth, bisacodyl, bisacodyl, diphenhydrAMINE, diphenhydrAMINE, diphenhydrAMINE, diphenhydrAMINE, diphenhydrAMINE, diphenhydrAMINE, ibuprofen, magnesium hydroxide, menthol-cetylpyridinium, methocarbamol(ROBAXIN) IV, methocarbamol, metoCLOPramide (REGLAN) injection, metoCLOPramide (REGLAN) injection, metoCLOPramide, naloxone (NARCAN) infusion, naloxone, naloxone, ondansetron (ZOFRAN) IV ondansetron (ZOFRAN)  IV, ondansetron (ZOFRAN) IV, ondansetron, oxyCODONE, phenol, polyethylene glycol, sodium chloride, sodium chloride, sodium phosphate, temazepam  Inpatient warfarin doses this admission: 3mg  11/15, 6mg  11/16, 6mg  11/17   Assessment: -Warfarin initiation in progress along with prophylactic-dose LMWH bridging - INR subtherapeutic, but trending up  Goal of Therapy:  INR 2-3   Plan:  -Warfarin 6mg  PO today x 1.  - Continue Lovenox prophylaxis. -Follow PT / INR daily.   Lynann Beaver PharmD  Pager [phone] 08/08/2011 11:10 AM

## 2011-08-09 LAB — PROTIME-INR
INR: 1.84 — ABNORMAL HIGH (ref 0.00–1.49)
Prothrombin Time: 21.6 seconds — ABNORMAL HIGH (ref 11.6–15.2)

## 2011-08-09 LAB — PREPARE RBC (CROSSMATCH)

## 2011-08-09 LAB — CBC
HCT: 19.9 % — ABNORMAL LOW (ref 39.0–52.0)
Hemoglobin: 6.9 g/dL — CL (ref 13.0–17.0)
MCHC: 34.7 g/dL (ref 30.0–36.0)
RBC: 2.23 MIL/uL — ABNORMAL LOW (ref 4.22–5.81)
WBC: 6.7 10*3/uL (ref 4.0–10.5)

## 2011-08-09 MED ORDER — BISACODYL 5 MG PO TBEC: 10.0000 mg | DELAYED_RELEASE_TABLET | Freq: Every day | ORAL | Status: AC | PRN

## 2011-08-09 MED ORDER — ACETAMINOPHEN 325 MG PO TABS: 650.0000 mg | ORAL_TABLET | Freq: Four times a day (QID) | ORAL | Status: AC | PRN

## 2011-08-09 MED ORDER — OXYCODONE HCL 5 MG PO TABS: 5.0000 mg | ORAL_TABLET | ORAL | Status: AC | PRN

## 2011-08-09 MED ORDER — METHOCARBAMOL 500 MG PO TABS: 500.0000 mg | ORAL_TABLET | Freq: Four times a day (QID) | ORAL | Status: AC | PRN

## 2011-08-09 MED ORDER — WARFARIN SODIUM 5 MG PO TABS
5.0000 mg | ORAL_TABLET | Freq: Once | ORAL | Status: DC
  Filled 2011-08-09: qty 1

## 2011-08-09 MED ORDER — DSS 100 MG PO CAPS: 100.0000 mg | ORAL_CAPSULE | Freq: Two times a day (BID) | ORAL | Status: AC

## 2011-08-09 MED ORDER — POLYSACCHARIDE IRON 150 MG PO CAPS: 150.0000 mg | ORAL_CAPSULE | Freq: Every day | ORAL | Status: DC

## 2011-08-09 MED ORDER — MAGNESIUM HYDROXIDE 400 MG/5ML PO SUSP: 30.0000 mL | Freq: Two times a day (BID) | ORAL | Status: AC | PRN

## 2011-08-09 MED ORDER — POLYETHYLENE GLYCOL 3350 17 G PO PACK: 17.0000 g | PACK | Freq: Every day | ORAL | Status: AC | PRN

## 2011-08-09 MED ORDER — ALUM & MAG HYDROXIDE-SIMETH 200-200-20 MG/5ML PO SUSP: 30.0000 mL | ORAL | Status: AC | PRN

## 2011-08-09 MED ORDER — DIPHENHYDRAMINE HCL 25 MG PO CAPS: 25.0000 mg | ORAL_CAPSULE | ORAL | Status: AC | PRN

## 2011-08-09 MED ORDER — TEMAZEPAM 15 MG PO CAPS: 15.0000 mg | ORAL_CAPSULE | Freq: Every evening | ORAL | Status: AC | PRN

## 2011-08-09 MED ORDER — ACETAMINOPHEN 10 MG/ML IV SOLN
1000.0000 mg | Freq: Once | INTRAVENOUS | Status: AC
  Administered 2011-08-09: 1000 mg via INTRAVENOUS
  Filled 2011-08-09: qty 100

## 2011-08-09 NOTE — Progress Notes (Signed)
Ambulance transport is in room getting pt on stretcher.  Pt being d/c to snf at this time. Pt finished his 2u PRBC and is in stable condition.

## 2011-08-09 NOTE — Progress Notes (Signed)
Subjective: 5 Days Post-Op Procedure(s) (LRB): TOTAL KNEE BILATERAL (Bilateral) Patient reports pain as mild.   Patient seen in rounds with Dr. Lequita Halt. Patient has complaints of knee soreness.  The right knee is more so than the left knee.  Hgb down to 6.9.  Will need blood today.  Plan is for [facility] today after blood.  Objective: Vital signs in last 24 hours: Temp:  [98.4 F (36.9 C)-99.6 F (37.6 C)] 98.4 F (36.9 C) (11/18 2135) Pulse Rate:  [89-95] 89  (11/18 2135) Resp:  [16-18] 16  (11/18 2135) BP: (129-133)/(67-71) 133/71 mmHg (11/18 2135) SpO2:  [95 %-99 %] 97 % (11/19 0103) FiO2 (%):  [100 %] 100 % (11/18 1600)  Intake/Output from previous day:  Intake/Output Summary (Last 24 hours) at 08/09/11 0657 Last data filed at 08/09/11 1610  Gross per 24 hour  Intake   1208 ml  Output   2327 ml  Net  -1119 ml    Intake/Output this shift: Total I/O In: 248 [I.V.:248] Out: 1250 [Urine:1250]  Labs: Results for orders placed during the hospital encounter of 08/04/11  TYPE AND SCREEN      Component Value Range   ABO/RH(D) A POS     Antibody Screen NEG     Sample Expiration 08/07/2011    ABO/RH      Component Value Range   ABO/RH(D) A POS    PROTIME-INR      Component Value Range   Prothrombin Time 14.8  11.6 - 15.2 (seconds)   INR 1.14  0.00 - 1.49   CBC      Component Value Range   WBC 6.9  4.0 - 10.5 (K/uL)   RBC 3.31 (*) 4.22 - 5.81 (MIL/uL)   Hemoglobin 10.3 (*) 13.0 - 17.0 (g/dL)   HCT 96.0 (*) 45.4 - 52.0 (%)   MCV 92.1  78.0 - 100.0 (fL)   MCH 31.1  26.0 - 34.0 (pg)   MCHC 33.8  30.0 - 36.0 (g/dL)   RDW 09.8  11.9 - 14.7 (%)   Platelets 167  150 - 400 (K/uL)  BASIC METABOLIC PANEL      Component Value Range   Sodium 137  135 - 145 (mEq/L)   Potassium 4.4  3.5 - 5.1 (mEq/L)   Chloride 102  96 - 112 (mEq/L)   CO2 29  19 - 32 (mEq/L)   Glucose, Bld 120 (*) 70 - 99 (mg/dL)   BUN 12  6 - 23 (mg/dL)   Creatinine, Ser 8.29  0.50 - 1.35 (mg/dL)   Calcium  8.7  8.4 - 10.5 (mg/dL)   GFR calc non Af Amer 72 (*) >90 (mL/min)   GFR calc Af Amer 83 (*) >90 (mL/min)  PROTIME-INR      Component Value Range   Prothrombin Time 14.7  11.6 - 15.2 (seconds)   INR 1.13  0.00 - 1.49   CBC      Component Value Range   WBC 7.4  4.0 - 10.5 (K/uL)   RBC 2.74 (*) 4.22 - 5.81 (MIL/uL)   Hemoglobin 8.8 (*) 13.0 - 17.0 (g/dL)   HCT 56.2 (*) 13.0 - 52.0 (%)   MCV 90.9  78.0 - 100.0 (fL)   MCH 32.1  26.0 - 34.0 (pg)   MCHC 35.3  30.0 - 36.0 (g/dL)   RDW 86.5  78.4 - 69.6 (%)   Platelets 148 (*) 150 - 400 (K/uL)  BASIC METABOLIC PANEL      Component Value Range  Sodium 135  135 - 145 (mEq/L)   Potassium 3.9  3.5 - 5.1 (mEq/L)   Chloride 101  96 - 112 (mEq/L)   CO2 28  19 - 32 (mEq/L)   Glucose, Bld 126 (*) 70 - 99 (mg/dL)   BUN 8  6 - 23 (mg/dL)   Creatinine, Ser 1.61  0.50 - 1.35 (mg/dL)   Calcium 8.5  8.4 - 09.6 (mg/dL)   GFR calc non Af Amer 74 (*) >90 (mL/min)   GFR calc Af Amer 86 (*) >90 (mL/min)  PROTIME-INR      Component Value Range   Prothrombin Time 16.0 (*) 11.6 - 15.2 (seconds)   INR 1.25  0.00 - 1.49   CBC      Component Value Range   WBC 11.7 (*) 4.0 - 10.5 (K/uL)   RBC 2.82 (*) 4.22 - 5.81 (MIL/uL)   Hemoglobin 8.8 (*) 13.0 - 17.0 (g/dL)   HCT 04.5 (*) 40.9 - 52.0 (%)   MCV 91.1  78.0 - 100.0 (fL)   MCH 31.2  26.0 - 34.0 (pg)   MCHC 34.2  30.0 - 36.0 (g/dL)   RDW 81.1  91.4 - 78.2 (%)   Platelets 195  150 - 400 (K/uL)  BASIC METABOLIC PANEL      Component Value Range   Sodium 131 (*) 135 - 145 (mEq/L)   Potassium 4.0  3.5 - 5.1 (mEq/L)   Chloride 96  96 - 112 (mEq/L)   CO2 26  19 - 32 (mEq/L)   Glucose, Bld 147 (*) 70 - 99 (mg/dL)   BUN 9  6 - 23 (mg/dL)   Creatinine, Ser 9.56  0.50 - 1.35 (mg/dL)   Calcium 9.2  8.4 - 21.3 (mg/dL)   GFR calc non Af Amer 66 (*) >90 (mL/min)   GFR calc Af Amer 77 (*) >90 (mL/min)  CBC      Component Value Range   WBC 8.1  4.0 - 10.5 (K/uL)   RBC 2.28 (*) 4.22 - 5.81 (MIL/uL)    Hemoglobin 7.2 (*) 13.0 - 17.0 (g/dL)   HCT 08.6 (*) 57.8 - 52.0 (%)   MCV 88.2  78.0 - 100.0 (fL)   MCH 31.6  26.0 - 34.0 (pg)   MCHC 35.8  30.0 - 36.0 (g/dL)   RDW 46.9  62.9 - 52.8 (%)   Platelets 196  150 - 400 (K/uL)  PROTIME-INR      Component Value Range   Prothrombin Time 19.5 (*) 11.6 - 15.2 (seconds)   INR 1.62 (*) 0.00 - 1.49   PROTIME-INR      Component Value Range   Prothrombin Time 21.6 (*) 11.6 - 15.2 (seconds)   INR 1.84 (*) 0.00 - 1.49   CBC      Component Value Range   WBC 6.7  4.0 - 10.5 (K/uL)   RBC 2.23 (*) 4.22 - 5.81 (MIL/uL)   Hemoglobin 6.9 (*) 13.0 - 17.0 (g/dL)   HCT 41.3 (*) 24.4 - 52.0 (%)   MCV 89.2  78.0 - 100.0 (fL)   MCH 30.9  26.0 - 34.0 (pg)   MCHC 34.7  30.0 - 36.0 (g/dL)   RDW 01.0  27.2 - 53.6 (%)   Platelets 244  150 - 400 (K/uL)    Exam - Neurovascular intact Sensation intact distally Intact pulses distally Dressing/Incision - clean, dry, no drainage Motor function intact - moving foot and toes well on exam.   Assessment/Plan: 5 Days Post-Op Procedure(s) (LRB): TOTAL  KNEE BILATERAL (Bilateral)  D/C IV fluids after blood. Plan for [facility] after blood today.  Past Medical History  Diagnosis Date  . Elevated cholesterol   . Arthritis   . Hiatal hernia   . Enlarged prostate   . Glaucoma   . Hearing loss     DVT Prophylaxis  - Coumadin Protocol Weight-Bearing as tolerated to both leg Transfer to [place] later today.  Patrica Duel 08/09/2011, 6:57 AM

## 2011-08-09 NOTE — Progress Notes (Signed)
ANTICOAGULATION CONSULT NOTE - Follow Up Consult  Pharmacy Consult for Coumadin Indication: Post-op VTE prophylaxis  No Known Allergies  Patient Measurements: Height: 5\' 7"  (170.2 cm) Weight: 151 lb 9 oz (68.748 kg) IBW/kg (Calculated) : 66.1    Vital Signs: Temp: 98.2 F (36.8 C) (11/19 1200) Temp src: Oral (11/19 1200) BP: 137/76 mmHg (11/19 1200) Pulse Rate: 83  (11/19 1200)  Labs:  Basename 08/09/11 0405 08/08/11 0900 08/08/11 0535 08/07/11 0432  HGB 6.9* -- 7.2* --  HCT 19.9* -- 20.1* 25.7*  PLT 244 -- 196 195  APTT -- -- -- --  LABPROT 21.6* 19.5* -- 16.0*  INR 1.84* 1.62* -- 1.25  HEPARINUNFRC -- -- -- --  CREATININE -- -- -- 1.10  CKTOTAL -- -- -- --  CKMB -- -- -- --  TROPONINI -- -- -- --   Estimated Creatinine Clearance: 58.4 ml/min (by C-G formula based on Cr of 1.1).   Medications:  Scheduled:    . acetaminophen  1,000 mg Intravenous Once  . bimatoprost  1 drop Both Eyes QHS  . brimonidine  1 drop Left Eye BID  . docusate sodium  100 mg Oral BID  . dorzolamide-timolol  1 drop Both Eyes BID  . dutasteride  0.5 mg Oral QPM  . enoxaparin (LOVENOX) injection  30 mg Subcutaneous Q12H  . morphine   Intravenous Q4H  . niacin  250 mg Oral QHS  . polysaccharide iron  150 mg Oral Daily  . scopolamine  1 patch Transdermal Once  . simvastatin  20 mg Oral q1800  . warfarin  6 mg Oral ONCE-1800   Infusions:    . dextrose 5 % and 0.9% NaCl 20 mL/hr at 08/07/11 0219  . naloxone [facility]) infusion Stopped (08/06/11 0815)   PRN: acetaminophen, acetaminophen, alum & mag hydroxide-simeth, bisacodyl, bisacodyl, diphenhydrAMINE, diphenhydrAMINE, diphenhydrAMINE, diphenhydrAMINE, diphenhydrAMINE, diphenhydrAMINE, ibuprofen, magnesium hydroxide, menthol-cetylpyridinium, methocarbamol(ROBAXIN) IV, methocarbamol, metoCLOPramide (REGLAN) injection, metoCLOPramide (REGLAN) injection, metoCLOPramide, naloxone (NARCAN) infusion, naloxone, naloxone, ondansetron (ZOFRAN)  IV ondansetron (ZOFRAN) IV, ondansetron (ZOFRAN) IV, ondansetron, oxyCODONE, phenol, polyethylene glycol, sodium chloride, sodium chloride, sodium phosphate, temazepam  Assessment: INR approaching therapeutic rate at 1.84 after doses of 3mg , 6mg , 6mg , 6mg .  Discharge planned for today.  Reviewed main points of Coumadin therapy and provided handout.  Good understanding expressed.  Goal of Therapy:  INR 2-3   Plan:  Coumadin 5mg  today if remains in hospital at 6:00 p.m.  Recommended close follow-up of INR after discharge, with next INR on 08/10/11.  Polo Riley R.Ph. 08/09/2011,12:54 PM

## 2011-08-09 NOTE — Progress Notes (Signed)
Physical Therapy Treatment Patient Details Name: [name] [name] MRN: [id number] DOB: [birth year] Today's Date: 08/09/2011 1135-1200 2e PT Assessment/Plan  PT - Assessment/Plan Comments on Treatment Session: pt tolerated exercise well with no pain meds..  will ambulate after meds and unit #2 of blood started PT Plan: Discharge plan remains appropriate PT Frequency: 7X/week Follow Up Recommendations: Skilled nursing facility Equipment Recommended: Defer to next venue PT Goals  Acute Rehab PT Goals PT Goal Formulation: With patient Time For Goal Achievement: 7 days Pt will Perform Home Exercise Program: with supervision, verbal cues required/provided PT Goal: Perform Home Exercise Program - Progress: Progressing toward goal  PT Treatment Precautions/Restrictions  Precautions Precautions: Knee Required Braces or Orthoses: Yes Knee Immobilizer:  (R KI in place; L KI not used) Restrictions Weight Bearing Restrictions: No RLE Weight Bearing: Weight bearing as tolerated LLE Weight Bearing: Weight bearing as tolerated Mobility (including Balance) Bed Mobility Bed Mobility: No Transfers Transfers: No Ambulation/Gait Ambulation/Gait: No    Exercise  Total Joint Exercises Ankle Circles/Pumps: AROM;Both;20 reps;Supine Quad Sets: AROM;Both;20 reps;Supine Short Arc Quad: Other reps (comment) (20) Heel Slides: AROM;Both;20 reps;Supine Straight Leg Raises: AROM;20 reps;Supine End of Session PT - End of Session Equipment Utilized During Treatment:  (sheet to assit with therex) Activity Tolerance: Patient tolerated treatment well Patient left: in bed;with call bell in reach General Behavior During Session: [facility] for tasks performed Cognition: [facility] for tasks performed  [name] 08/09/2011, 12:46 PM

## 2011-08-09 NOTE — Progress Notes (Signed)
CSW informed by pts nurse Lillia Abed that pt was still getting a blood transfusion. CSW arranged transportatuin via [facility] for pt to go to the facility. CSW requested they pick pt up at 16:15. CSW informed the pt, his family, his nurse Lillia Abed and [place] on the unit that transportation had been arranged. CSW has packet completed to be transported with the pt.  CSW given go ahead to send pt per Jasmine December at [facility]. CSW signing off. Golden Pop 08/09/2011 3:38 PM [phone]

## 2011-08-09 NOTE — Progress Notes (Signed)
Physical Therapy Treatment Patient Details Name: [name] [name] MRN: [id number] DOB: 1941/04/29 Today's Date: 08/09/2011 1420-1450 2g PT Assessment/Plan  PT - Assessment/Plan Comments on Treatment Session: pt tolerated well with no KI PT Plan: Discharge plan remains appropriate PT Frequency: 7X/week Follow Up Recommendations: Skilled nursing facility Equipment Recommended: Defer to next venue PT Goals  Acute Rehab PT Goals PT Goal Formulation: With patient Time For Goal Achievement: 7 days Pt will go Supine/Side to Sit: with supervision;with HOB 0 degrees PT Goal: Supine/Side to Sit - Progress: Progressing toward goal Pt will go Sit to Supine/Side: with supervision;with HOB 0 degrees PT Goal: Sit to Supine/Side - Progress: Progressing toward goal Pt will Transfer Sit to Stand/Stand to Sit: with supervision PT Transfer Goal: Sit to Stand/Stand to Sit - Progress: Progressing toward goal Pt will Ambulate: >150 feet;with min assist PT Goal: Ambulate - Progress: Progressing toward goal Pt will Perform Home Exercise Program: with supervision, verbal cues required/provided PT Goal: Perform Home Exercise Program - Progress: Progressing toward goal  PT Treatment Precautions/Restrictions  Precautions Precautions: Knee Required Braces or Orthoses: No Knee Immobilizer:  (DC'd due to + SLR both) Restrictions Weight Bearing Restrictions: No RLE Weight Bearing: Weight bearing as tolerated LLE Weight Bearing: Weight bearing as tolerated Mobility (including Balance) Bed Mobility Bed Mobility: Yes Supine to Sit: 3: Mod assist;HOB elevated (Comment degrees) (50) Supine to Sit Details (indicate cue type and reason): assit to move legs Sitting - Scoot to Edge of Bed: With rail;4: Min assist Transfers Transfers: Yes Sit to Stand: From bed;From elevated surface;3: Mod assist Stand to Sit: 4: Min assist;To chair/3-in-1;With upper extremity assist Stand to Sit Details: vc to walk legs  out Ambulation/Gait Ambulation/Gait: Yes Ambulation/Gait Assistance: 4: Min assist (had another person for safety) Ambulation Distance (Feet): 400 Feet Assistive device: Rolling walker Gait Pattern: Step-through pattern Gait velocity: fairly good speed    Exercise  Total Joint Exercises Long Arc Quad: AROM;Right;Left;20 reps;Seated Knee Flexion: AROM;20 reps;Seated End of Session PT - End of Session Activity Tolerance: Patient tolerated treatment well Patient left: in chair;with call bell in reach;with family/visitor present General Behavior During Session: [facility] for tasks performed Cognition: [facility] for tasks performed  [name] 08/09/2011, 4:14 PM

## 2011-08-09 NOTE — Progress Notes (Signed)
CSW spoke with the pt who is awaiting a blood transfusion so he can discharge and go to a facility. CSW has faxed discharge clinicals to Jasmine December at [facility] and is awaiting a return call that they were received. CSW has spoke with nurse Cloria Spring and she will contact CSW once blood transfusion is complete. CSW will arrange transportation via [facility]. CSW will continue to offer support.  Patrice Paradise, LCSWA 08/09/2011 9:04 AM [phone]

## 2011-08-09 NOTE — Discharge Summary (Signed)
Physician Discharge Summary   Patient ID: [name] [name] MRN: [id number] DOB/AGE: [birth year] 70 y.o.  Admit date: 08/04/2011 Discharge date: 08/09/2011  Primary Diagnosis: Osteoarthrosis Bilateral Knees   Admission Diagnoses: Past Medical History  Diagnosis Date  . Elevated cholesterol   . Arthritis   . Hiatal hernia   . Enlarged prostate   . Glaucoma   . Hearing loss     Discharge Diagnoses:  Principal Problem:  *Osteoarthritis bilateral knees Postop Acute Blood Loss Anemia Postop Hyponatremia S/P Transfusion  Procedure: Procedure(s) (LRB): TOTAL KNEE BILATERAL (Bilateral)   Consults: Anesthesia for Epidural Management  HPI:  [name] [name] is a 70 y.o. year old male with end stage OA of both knees with progressively worsening pain and dysfunction. He has constant pain, with activity and at rest and significant functional deficits with difficulties even with ADLs. He has had extensive non-op management including analgesics, injections of cortisone and viscosupplements, and home exercise program, but remains in significant pain with significant dysfunction. Radiographs show bone on bone arthritis of both knees in the medial and patellofemoral compartments with significant varus deformities and bilateral tibial subluxation.He presents now for Bilateral Total Knee Arthroplasty.      Laboratory Data: Results for orders placed during the hospital encounter of 08/04/11  TYPE AND SCREEN      Component Value Range   ABO/RH(D) A POS     Antibody Screen NEG     Sample Expiration 08/07/2011    ABO/RH      Component Value Range   ABO/RH(D) A POS    PROTIME-INR      Component Value Range   Prothrombin Time 14.8  11.6 - 15.2 (seconds)   INR 1.14  0.00 - 1.49   CBC      Component Value Range   WBC 6.9  4.0 - 10.5 (K/uL)   RBC 3.31 (*) 4.22 - 5.81 (MIL/uL)   Hemoglobin 10.3 (*) 13.0 - 17.0 (g/dL)   HCT 40.9 (*) 81.1 - 52.0 (%)   MCV 92.1  78.0 - 100.0 (fL)   MCH 31.1   26.0 - 34.0 (pg)   MCHC 33.8  30.0 - 36.0 (g/dL)   RDW 91.4  78.2 - 95.6 (%)   Platelets 167  150 - 400 (K/uL)  BASIC METABOLIC PANEL      Component Value Range   Sodium 137  135 - 145 (mEq/L)   Potassium 4.4  3.5 - 5.1 (mEq/L)   Chloride 102  96 - 112 (mEq/L)   CO2 29  19 - 32 (mEq/L)   Glucose, Bld 120 (*) 70 - 99 (mg/dL)   BUN 12  6 - 23 (mg/dL)   Creatinine, Ser 2.13  0.50 - 1.35 (mg/dL)   Calcium 8.7  8.4 - 08.6 (mg/dL)   GFR calc non Af Amer 72 (*) >90 (mL/min)   GFR calc Af Amer 83 (*) >90 (mL/min)  PROTIME-INR      Component Value Range   Prothrombin Time 14.7  11.6 - 15.2 (seconds)   INR 1.13  0.00 - 1.49   CBC      Component Value Range   WBC 7.4  4.0 - 10.5 (K/uL)   RBC 2.74 (*) 4.22 - 5.81 (MIL/uL)   Hemoglobin 8.8 (*) 13.0 - 17.0 (g/dL)   HCT 57.8 (*) 46.9 - 52.0 (%)   MCV 90.9  78.0 - 100.0 (fL)   MCH 32.1  26.0 - 34.0 (pg)   MCHC 35.3  30.0 - 36.0 (g/dL)  RDW 13.9  11.5 - 15.5 (%)   Platelets 148 (*) 150 - 400 (K/uL)  BASIC METABOLIC PANEL      Component Value Range   Sodium 135  135 - 145 (mEq/L)   Potassium 3.9  3.5 - 5.1 (mEq/L)   Chloride 101  96 - 112 (mEq/L)   CO2 28  19 - 32 (mEq/L)   Glucose, Bld 126 (*) 70 - 99 (mg/dL)   BUN 8  6 - 23 (mg/dL)   Creatinine, Ser 1.61  0.50 - 1.35 (mg/dL)   Calcium 8.5  8.4 - 09.6 (mg/dL)   GFR calc non Af Amer 74 (*) >90 (mL/min)   GFR calc Af Amer 86 (*) >90 (mL/min)  PROTIME-INR      Component Value Range   Prothrombin Time 16.0 (*) 11.6 - 15.2 (seconds)   INR 1.25  0.00 - 1.49   CBC      Component Value Range   WBC 11.7 (*) 4.0 - 10.5 (K/uL)   RBC 2.82 (*) 4.22 - 5.81 (MIL/uL)   Hemoglobin 8.8 (*) 13.0 - 17.0 (g/dL)   HCT 04.5 (*) 40.9 - 52.0 (%)   MCV 91.1  78.0 - 100.0 (fL)   MCH 31.2  26.0 - 34.0 (pg)   MCHC 34.2  30.0 - 36.0 (g/dL)   RDW 81.1  91.4 - 78.2 (%)   Platelets 195  150 - 400 (K/uL)  BASIC METABOLIC PANEL      Component Value Range   Sodium 131 (*) 135 - 145 (mEq/L)   Potassium 4.0   3.5 - 5.1 (mEq/L)   Chloride 96  96 - 112 (mEq/L)   CO2 26  19 - 32 (mEq/L)   Glucose, Bld 147 (*) 70 - 99 (mg/dL)   BUN 9  6 - 23 (mg/dL)   Creatinine, Ser 9.56  0.50 - 1.35 (mg/dL)   Calcium 9.2  8.4 - 21.3 (mg/dL)   GFR calc non Af Amer 66 (*) >90 (mL/min)   GFR calc Af Amer 77 (*) >90 (mL/min)  CBC      Component Value Range   WBC 8.1  4.0 - 10.5 (K/uL)   RBC 2.28 (*) 4.22 - 5.81 (MIL/uL)   Hemoglobin 7.2 (*) 13.0 - 17.0 (g/dL)   HCT 08.6 (*) 57.8 - 52.0 (%)   MCV 88.2  78.0 - 100.0 (fL)   MCH 31.6  26.0 - 34.0 (pg)   MCHC 35.8  30.0 - 36.0 (g/dL)   RDW 46.9  62.9 - 52.8 (%)   Platelets 196  150 - 400 (K/uL)  PROTIME-INR      Component Value Range   Prothrombin Time 19.5 (*) 11.6 - 15.2 (seconds)   INR 1.62 (*) 0.00 - 1.49   PROTIME-INR      Component Value Range   Prothrombin Time 21.6 (*) 11.6 - 15.2 (seconds)   INR 1.84 (*) 0.00 - 1.49   CBC      Component Value Range   WBC 6.7  4.0 - 10.5 (K/uL)   RBC 2.23 (*) 4.22 - 5.81 (MIL/uL)   Hemoglobin 6.9 (*) 13.0 - 17.0 (g/dL)   HCT 41.3 (*) 24.4 - 52.0 (%)   MCV 89.2  78.0 - 100.0 (fL)   MCH 30.9  26.0 - 34.0 (pg)   MCHC 34.7  30.0 - 36.0 (g/dL)   RDW 01.0  27.2 - 53.6 (%)   Platelets 244  150 - 400 (K/uL)  PREPARE RBC (CROSSMATCH)  Component Value Range   Order Confirmation ORDER PROCESSED BY BLOOD BANK      Office Visit on 07/29/2011  Component Date Value Range Status  . aPTT (seconds) 07/29/2011 37  24-37 Final   Comment:                                 IF BASELINE aPTT IS ELEVATED,                          SUGGEST PATIENT RISK ASSESSMENT                          BE USED TO DETERMINE APPROPRIATE                          ANTICOAGULANT THERAPY.  . WBC (K/uL) 07/29/2011 4.9  4.0-10.5 Final  . RBC (MIL/uL) 07/29/2011 4.75  4.22-5.81 Final  . Hemoglobin (g/dL) 16/06/9603 54.0  98.1-19.1 Final  . HCT (%) 07/29/2011 43.4  39.0-52.0 Final  . MCV (fL) 07/29/2011 91.4  78.0-100.0 Final  . MCH (pg) 07/29/2011  32.2  26.0-34.0 Final  . MCHC (g/dL) 47/82/9562 13.0  86.5-78.4 Final  . RDW (%) 07/29/2011 13.8  11.5-15.5 Final  . Platelets (K/uL) 07/29/2011 191  150-400 Final  . Sodium (mEq/L) 07/29/2011 137  135-145 Final  . Potassium (mEq/L) 07/29/2011 4.0  3.5-5.1 Final  . Chloride (mEq/L) 07/29/2011 101  96-112 Final  . CO2 (mEq/L) 07/29/2011 29  19-32 Final  . Glucose, Bld (mg/dL) 69/62/9528 85  41-32 Final  . BUN (mg/dL) 44/09/270 13  5-36 Final  . Creatinine, Ser (mg/dL) 64/40/3474 2.59  5.63-8.75 Final  . Calcium (mg/dL) 64/33/2951 9.9  8.8-41.6 Final  . Total Protein (g/dL) 60/63/0160 7.4  1.0-9.3 Final  . Albumin (g/dL) 23/55/7322 4.0  0.2-5.4 Final  . AST (U/L) 07/29/2011 23  0-37 Final  . ALT (U/L) 07/29/2011 21  0-53 Final  . Alkaline Phosphatase (U/L) 07/29/2011 52  39-117 Final  . Total Bilirubin (mg/dL) 27/02/2375 0.5  2.8-3.1 Final  . GFR calc non Af Amer (mL/min) 07/29/2011 82* >90 Final  . GFR calc Af Amer (mL/min) 07/29/2011 >90  >90 Final   Comment:                                 The eGFR has been calculated                          using the CKD EPI equation.                          This calculation has not been                          validated in all clinical                          situations.                          eGFR's persistently                          <  90 mL/min signify                          possible Chronic Kidney Disease.  Marland Kitchen Prothrombin Time (seconds) 07/29/2011 12.7  11.6-15.2 Final  . INR  07/29/2011 0.93  0.00-1.49 Final  . Color, Urine  07/29/2011 YELLOW  YELLOW Final  . Appearance  07/29/2011 CLEAR  CLEAR Final  . Specific Gravity, Urine  07/29/2011 1.007  1.005-1.030 Final  . pH  07/29/2011 7.5  5.0-8.0 Final  . Glucose, UA (mg/dL) 16/06/9603 NEGATIVE  NEGATIVE Final  . Hgb urine dipstick  07/29/2011 NEGATIVE  NEGATIVE Final  . Bilirubin Urine  07/29/2011 NEGATIVE  NEGATIVE Final  . Ketones, ur (mg/dL) 54/05/8118 NEGATIVE  NEGATIVE Final    . Protein, ur (mg/dL) 14/78/2956 NEGATIVE  NEGATIVE Final  . Urobilinogen, UA (mg/dL) 21/30/8657 1.0  8.4-6.9 Final  . Nitrite  07/29/2011 NEGATIVE  NEGATIVE Final  . Leukocytes, UA  07/29/2011 NEGATIVE  NEGATIVE Final   MICROSCOPIC NOT DONE ON URINES WITH NEGATIVE PROTEIN, BLOOD, LEUKOCYTES, NITRITE, OR GLUCOSE <1000 mg/dL.  Marland Kitchen MRSA, PCR  07/29/2011 NEGATIVE  NEGATIVE Final  . Staphylococcus aureus  07/29/2011 NEGATIVE  NEGATIVE Final   Comment:                                 The Xpert SA Assay (FDA                          approved for NASAL specimens                          only), is one component of                          a comprehensive surveillance                          program.  It is not intended                          to diagnose infection nor to                          guide or monitor treatment.    X-Rays:Dg Chest 2 View  07/29/2011  *RADIOLOGY REPORT*  Clinical Data: Preop  CHEST - 2 VIEW  Comparison: None.  Findings: Cardiomediastinal silhouette is unremarkable.  No acute infiltrate or pleural effusion.  No pulmonary edema.  Mild degenerative changes thoracic spine.  IMPRESSION: No active disease.  Original Report Authenticated By: Natasha Mead, M.D.    EKG:No orders found for this or any previous visit.   Hospital Course: Patient was admitted to [facility] and taken to the OR and underwent the above state procedure without complications.  Patient tolerated the procedure well and was later transferred to the recovery room and then to the orthopaedic floor for postoperative care.  They were given PO and IV analgesics for pain control following their surgery.  They were given 24 hours of postoperative antibiotics and started on DVT prophylaxis.   PT and OT were ordered for total joint protocol.  Discharge planning consulted to help with postop disposition and equipment needs.  Patient had a  good night on the evening of surgery and started to get up with therapy on  day one.  PCA was discontinued and they were weaned over to PO meds.  Hemovac drain was pulled without difficulty.  Continued to progress with therapy into day two.  Dressing was changed on day two and the incision was healing well.  By day three, the patient had progressed with therapy and meeting goals.  Incision was healing well.  Patient wanted to look into SNF so we got social worker involved.  He wanted to look into [facility]  He was kept over the weekend and continued to progress on Sat and Sun with therapy.  He was seen on Monday morning again by Dr. Lequita Halt and found to have low hgb of 6.9.  It was felt that he would benefit with a transfusion for the low hgb and to benefit with therapy.  He was to receive blood and then transfer to [facility].   Discharge Medications: See Below List in Summary  Diet:general and low sodium  Activity:WBAT   Follow-up:in 1 and 1/2 weeks on Thursday 08/19/2011  Disposition: [facility]  Discharged Condition: good   Discharge Orders    Future Orders Please Complete By Expires   Diet - low sodium heart healthy      Call MD / Call 911      Comments:   If you experience chest pain or shortness of breath, CALL 911 and be transported to the hospital emergency room.  If you develope a fever above 101 F, pus (white drainage) or increased drainage or redness at the wound, or calf pain, call your surgeon's office.   Constipation Prevention      Comments:   Drink plenty of fluids.  Prune juice may be helpful.  You may use a stool softener, such as Colace (over the counter) 100 mg twice a day.  Use MiraLax (over the counter) for constipation as needed.   Increase activity slowly as tolerated      Weight Bearing as taught in Physical Therapy      Comments:   Use a walker or crutches as instructed.   Discharge instructions      Comments:   Pick up stool softner and laxative for home. Do not submerge incision under water. May shower. Total Knee  Protocol.    Driving restrictions      Comments:   No driving   Lifting restrictions      Comments:   No lifting   TED hose      Comments:   Use stockings (TED hose) for three weeks on both leg(s).  You may remove them at night for sleeping.   Change dressing      Comments:   Change dressing daily with sterile 4 x 4 inch gauze dressing and apply TED hose.     Current Discharge Medication List    START taking these medications   Details  acetaminophen (TYLENOL) 325 MG tablet Take 2 tablets (650 mg total) by mouth every 6 (six) hours as needed (or Fever >/= 101). Qty: 30 tablet    alum & mag hydroxide-simeth (MAALOX/MYLANTA) 200-200-20 MG/5ML suspension Take 30 mLs by mouth as needed. Qty: 355 mL    bisacodyl (DULCOLAX) 5 MG EC tablet Take 2 tablets (10 mg total) by mouth daily as needed for constipation. Qty: 30 tablet    diphenhydrAMINE (BENADRYL) 25 mg capsule Take 1 capsule (25 mg total) by mouth every 4 (four) hours as needed  for itching. Qty: 30 capsule    docusate sodium 100 MG CAPS Take 100 mg by mouth 2 (two) times daily. Qty: 10 capsule    magnesium hydroxide (MILK OF MAGNESIA) 400 MG/5ML suspension Take 30 mLs by mouth every 12 (twelve) hours as needed for constipation. Qty: 360 mL    methocarbamol (ROBAXIN) 500 MG tablet Take 1 tablet (500 mg total) by mouth every 6 (six) hours as needed. Qty: 80 tablet, Refills: 0    oxyCODONE (OXY IR/ROXICODONE) 5 MG immediate release tablet Take 1-4 tablets (5-20 mg total) by mouth every 3 (three) hours as needed. Qty: 80 tablet, Refills: 0    polyethylene glycol (MIRALAX / GLYCOLAX) packet Take 17 g by mouth daily as needed. Qty: 14 each    polysaccharide iron (NIFEREX) 150 MG CAPS capsule Take 1 capsule (150 mg total) by mouth daily. Qty: 21 each, Refills: 0    temazepam (RESTORIL) 15 MG capsule Take 1-2 capsules (15-30 mg total) by mouth at bedtime as needed for sleep. Qty: 30 capsule      CONTINUE these  medications which have NOT CHANGED   Details  atorvastatin (LIPITOR) 10 MG tablet Take 10 mg by mouth every evening.     bimatoprost (LUMIGAN) 0.01 % SOLN Place 1 drop into both eyes at bedtime.     brimonidine (ALPHAGAN) 0.2 % ophthalmic solution Place 1 drop into the left eye 2 (two) times daily.     dorzolamide-timolol (COSOPT) 22.3-6.8 MG/ML ophthalmic solution Place 1 drop into both eyes 2 (two) times daily.      dutasteride (AVODART) 0.5 MG capsule Take 0.5 mg by mouth every evening.     niacin 100 MG tablet Take 100 mg by mouth daily.        STOP taking these medications     aspirin EC 81 MG tablet      Calcium Carbonate-Vitamin D 600-125 MG-UNIT TABS      cholecalciferol (VITAMIN D) 1000 UNITS tablet      glucosamine-chondroitin 500-400 MG tablet      Multiple Vitamins-Minerals (MULTIVITAMINS THER. W/MINERALS) TABS        Follow-up Information    Follow up with ALUISIO,FRANK V. Make an appointment on 08/19/2011. (Please have [facility] call and make appt. for patient and help arrange transportation.)    Contact information:   [facility] [address], [address] [place] [address] [phone]          Signed: Patrica Duel 08/09/2011, 7:17 AM

## 2011-08-10 LAB — TYPE AND SCREEN
ABO/RH(D): A POS
Antibody Screen: NEGATIVE
Unit division: 0

## 2012-07-20 ENCOUNTER — Other Ambulatory Visit: Payer: Self-pay | Admitting: Dermatology

## 2013-06-04 NOTE — H&P (Signed)
  This is a 72 y/o wd/wn w male who presents with a swelling in the left buccal mucosa.  It has been present for several months and is slow growing.  It appears to be salivary gland in nature.  It is freely moveable and appears to be fluid filled.  Removal of it and a biopsy was recommended.  Because of the area and the difficulty in removing it, general anesthesia was recommended.

## 2013-06-06 ENCOUNTER — Encounter (HOSPITAL_BASED_OUTPATIENT_CLINIC_OR_DEPARTMENT_OTHER): Payer: Self-pay | Admitting: *Deleted

## 2013-06-06 NOTE — Progress Notes (Signed)
No labs needed

## 2013-06-13 ENCOUNTER — Ambulatory Visit (HOSPITAL_BASED_OUTPATIENT_CLINIC_OR_DEPARTMENT_OTHER): Payer: Medicare Other | Admitting: Anesthesiology

## 2013-06-13 ENCOUNTER — Ambulatory Visit (HOSPITAL_BASED_OUTPATIENT_CLINIC_OR_DEPARTMENT_OTHER)
Admission: RE | Admit: 2013-06-13 | Discharge: 2013-06-13 | Disposition: A | Discharge status: Home / Self Care | Payer: Medicare Other | Source: Ambulatory Visit | Attending: Oral Surgery | Admitting: Oral Surgery

## 2013-06-13 ENCOUNTER — Encounter (HOSPITAL_BASED_OUTPATIENT_CLINIC_OR_DEPARTMENT_OTHER): Payer: Self-pay | Admitting: *Deleted

## 2013-06-13 ENCOUNTER — Encounter (HOSPITAL_BASED_OUTPATIENT_CLINIC_OR_DEPARTMENT_OTHER)
Admission: RE | Disposition: A | Discharge status: Home / Self Care | Payer: Self-pay | Source: Ambulatory Visit | Attending: Oral Surgery

## 2013-06-13 ENCOUNTER — Encounter (HOSPITAL_BASED_OUTPATIENT_CLINIC_OR_DEPARTMENT_OTHER): Payer: Self-pay | Admitting: Anesthesiology

## 2013-06-13 DIAGNOSIS — D1779 Benign lipomatous neoplasm of other sites: Secondary | ICD-10-CM | POA: Insufficient documentation

## 2013-06-13 HISTORY — PX: EXCISION ORAL TUMOR: SHX6265

## 2013-06-13 SURGERY — EXCISION, NEOPLASM, MOUTH
Anesthesia: General | Site: Mouth | Laterality: Left | Wound class: Clean Contaminated

## 2013-06-13 MED ORDER — OXYCODONE HCL 5 MG/5ML PO SOLN: 5.0000 mg | Freq: Once | ORAL | Status: DC | PRN

## 2013-06-13 MED ORDER — LIDOCAINE HCL (CARDIAC) 20 MG/ML IV SOLN
INTRAVENOUS | Status: DC | PRN
  Administered 2013-06-13: 80 mg via INTRAVENOUS

## 2013-06-13 MED ORDER — LIDOCAINE-EPINEPHRINE 2 %-1:100000 IJ SOLN
INTRAMUSCULAR | Status: DC | PRN
  Administered 2013-06-13: 1.7 mL

## 2013-06-13 MED ORDER — SUCCINYLCHOLINE CHLORIDE 20 MG/ML IJ SOLN
INTRAMUSCULAR | Status: DC | PRN
  Administered 2013-06-13: 100 mg via INTRAVENOUS

## 2013-06-13 MED ORDER — FENTANYL CITRATE 0.05 MG/ML IJ SOLN: 25.0000 ug | INTRAMUSCULAR | Status: DC | PRN

## 2013-06-13 MED ORDER — MIDAZOLAM HCL 2 MG/2ML IJ SOLN: 1.0000 mg | INTRAMUSCULAR | Status: DC | PRN

## 2013-06-13 MED ORDER — CEFAZOLIN SODIUM-DEXTROSE 2-3 GM-% IV SOLR
2.0000 g | INTRAVENOUS | Status: AC
  Administered 2013-06-13: 2 g via INTRAVENOUS

## 2013-06-13 MED ORDER — OXYCODONE HCL 5 MG PO TABS: 5.0000 mg | ORAL_TABLET | Freq: Once | ORAL | Status: DC | PRN

## 2013-06-13 MED ORDER — MIDAZOLAM HCL 5 MG/5ML IJ SOLN
INTRAMUSCULAR | Status: DC | PRN
  Administered 2013-06-13: 2 mg via INTRAVENOUS

## 2013-06-13 MED ORDER — FENTANYL CITRATE 0.05 MG/ML IJ SOLN: 50.0000 ug | INTRAMUSCULAR | Status: DC | PRN

## 2013-06-13 MED ORDER — DEXAMETHASONE SODIUM PHOSPHATE 4 MG/ML IJ SOLN
INTRAMUSCULAR | Status: DC | PRN
  Administered 2013-06-13: 10 mg via INTRAVENOUS

## 2013-06-13 MED ORDER — LACTATED RINGERS IV SOLN
INTRAVENOUS | Status: DC
  Administered 2013-06-13 (×2): via INTRAVENOUS
  Administered 2013-06-13: 20 mL/h via INTRAVENOUS

## 2013-06-13 MED ORDER — PROPOFOL 10 MG/ML IV BOLUS
INTRAVENOUS | Status: DC | PRN
  Administered 2013-06-13: 170 mg via INTRAVENOUS

## 2013-06-13 MED ORDER — FENTANYL CITRATE 0.05 MG/ML IJ SOLN
INTRAMUSCULAR | Status: DC | PRN
  Administered 2013-06-13 (×2): 50 ug via INTRAVENOUS

## 2013-06-13 MED ORDER — FENTANYL CITRATE 0.05 MG/ML IJ SOLN: 50.0000 ug | Freq: Once | INTRAMUSCULAR | Status: DC

## 2013-06-13 SURGICAL SUPPLY — 31 items
BLADE SURG 15 STRL LF DISP TIS (BLADE) ×1 IMPLANT
BLADE SURG 15 STRL SS (BLADE) ×2
BUR OVAL 4.0X59 (BURR) IMPLANT
CANISTER SUCTION 1200CC (MISCELLANEOUS) ×2 IMPLANT
CATH ROBINSON RED A/P 10FR (CATHETERS) IMPLANT
CLOTH BEACON ORANGE TIMEOUT ST (SAFETY) ×2 IMPLANT
COVER MAYO STAND STRL (DRAPES) ×2 IMPLANT
COVER TABLE BACK 60X90 (DRAPES) ×2 IMPLANT
DRAPE U-SHAPE 76X120 STRL (DRAPES) ×2 IMPLANT
GAUZE PACKING IODOFORM 1/4X5 (PACKING) IMPLANT
GLOVE BIO SURGEON STRL SZ 6.5 (GLOVE) ×4 IMPLANT
GLOVE BIO SURGEON STRL SZ7.5 (GLOVE) ×2 IMPLANT
GOWN PREVENTION PLUS XLARGE (GOWN DISPOSABLE) ×6 IMPLANT
GOWN PREVENTION PLUS XXLARGE (GOWN DISPOSABLE) IMPLANT
IV NS 500ML (IV SOLUTION)
IV NS 500ML BAXH (IV SOLUTION) IMPLANT
NEEDLE DENTAL 27 LONG (NEEDLE) ×2 IMPLANT
NS IRRIG 1000ML POUR BTL (IV SOLUTION) ×2 IMPLANT
PACK BASIN DAY SURGERY FS (CUSTOM PROCEDURE TRAY) ×2 IMPLANT
SPONGE SURGIFOAM ABS GEL 12-7 (HEMOSTASIS) IMPLANT
SUT CHROMIC 3 0 PS 2 (SUTURE) IMPLANT
SUT CHROMIC 4 0 P 3 18 (SUTURE) ×2 IMPLANT
SUT SILK 3 0 PS 1 (SUTURE) IMPLANT
SUT VIC AB 4-0 P-3 18XBRD (SUTURE) ×1 IMPLANT
SUT VIC AB 4-0 P3 18 (SUTURE) ×2
SYR 50ML LL SCALE MARK (SYRINGE) ×2 IMPLANT
TOWEL OR 17X24 6PK STRL BLUE (TOWEL DISPOSABLE) ×4 IMPLANT
TOWEL OR NON WOVEN STRL DISP B (DISPOSABLE) ×2 IMPLANT
TUBE CONNECTING 20X1/4 (TUBING) ×2 IMPLANT
VENT IRR SPI W TUB AD (MISCELLANEOUS) IMPLANT
YANKAUER SUCT BULB TIP NO VENT (SUCTIONS) ×2 IMPLANT

## 2013-06-13 NOTE — Transfer of Care (Signed)
Immediate Anesthesia Transfer of Care Note  Patient: [name] [name]  Procedure(s) Performed: Procedure(s): EXCISION OF TUMOR LEFT CHEEK, INTRAORAL (Left)  Patient Location: PACU  Anesthesia Type:General  Level of Consciousness: sedated  Airway & Oxygen Therapy: Patient Spontanous Breathing and Patient connected to face mask oxygen  Post-op Assessment: Report given to PACU RN and Post -op Vital signs reviewed and stable  Post vital signs: Reviewed and stable  Complications: No apparent anesthesia complications

## 2013-06-13 NOTE — H&P (Signed)
No change in medical history.  

## 2013-06-13 NOTE — Anesthesia Preprocedure Evaluation (Addendum)
Anesthesia Evaluation  Patient identified by MRN, date of birth, ID band Patient awake    Reviewed: Allergy & Precautions, H&P , NPO status , Patient's Chart, lab work & pertinent test results  Airway Mallampati: I TM Distance: >3 FB Neck ROM: Full    Dental   Pulmonary former smoker,  breath sounds clear to auscultation        Cardiovascular Rhythm:Regular Rate:Normal     Neuro/Psych  Neuromuscular disease    GI/Hepatic hiatal hernia,   Endo/Other    Renal/GU      Musculoskeletal   Abdominal   Peds  Hematology   Anesthesia Other Findings   Reproductive/Obstetrics                          Anesthesia Physical Anesthesia Plan  ASA: II  Anesthesia Plan: General   Post-op Pain Management:    Induction: Intravenous  Airway Management Planned: Oral ETT  Additional Equipment:   Intra-op Plan:   Post-operative Plan: Extubation in OR  Informed Consent: I have reviewed the patients History and Physical, chart, labs and discussed the procedure including the risks, benefits and alternatives for the proposed anesthesia with the patient or authorized representative who has indicated his/her understanding and acceptance.     Plan Discussed with: CRNA and Surgeon  Anesthesia Plan Comments:         Anesthesia Quick Evaluation

## 2013-06-13 NOTE — Brief Op Note (Signed)
06/13/2013  9:09 AM  PATIENT:  [name] [name]  72 y.o. male  PRE-OPERATIVE DIAGNOSIS:  tumor of left buccal mucosa  POST-OPERATIVE DIAGNOSIS:  tumor of left buccal mucosa  PROCEDURE:  Procedure(s): EXCISION OF TUMOR LEFT CHEEK, INTRAORAL (Left)  SURGEON:  Surgeon(s) and Role:    * Hinton Dyer, DDS - Primary  PHYSICIAN ASSISTANT:   ASSISTANTS: Hadassah Pais, Montel Culver  ANESTHESIA:   general  EBL:  Total I/O In: 200 [I.V.:200] Out: -   BLOOD ADMINISTERED:none  DRAINS: none   LOCAL MEDICATIONS USED:  XYLOCAINE   SPECIMEN:  Source of Specimen:  Left buccal mucosa  DISPOSITION OF SPECIMEN:  PATHOLOGY  COUNTS:  YES  TOURNIQUET:  * No tourniquets in log *  DICTATION: .Dragon Dictation  PLAN OF CARE: Discharge to home after PACU  PATIENT DISPOSITION:  PACU - hemodynamically stable.  6 Delay start of Pharmacological VTE agent (>24hrs) due to surgical blood loss or risk of bleeding: no

## 2013-06-13 NOTE — Op Note (Signed)
The patient was brought to the operating room and placed in the supine position and which remained out the whole procedure. He was given preoperative antibiotics to cover his orthopedic prosthesis. He was intubated via an oral endotracheal tube and prepped and draped in usual fashion for an intraoral procedure. A timeout was performed. The left side of face results of prepped with Betadine paint. The throat was suctioned out and a moist open 4 x 4 gauze was placed around the endotracheal tube. Child-size bite block was used. With digital pressure extraorally, a #15 blade made a 3 cm incision in the left buccal mucosa. Sharp and blunt dissection encountered a large yellow mass which began coming out of the incision site. It was dissected down and removed from its base. There was good hemostasis. No bleeding points were noted. The soft tissue was closed with multiple 4-0 Vicryl sutures. 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

## 2013-06-13 NOTE — Anesthesia Postprocedure Evaluation (Signed)
  Anesthesia Post-op Note  Patient: [name] [name]  Procedure(s) Performed: Procedure(s): EXCISION OF TUMOR LEFT CHEEK, INTRAORAL (Left)  Patient Location: PACU  Anesthesia Type:General  Level of Consciousness: awake and alert   Airway and Oxygen Therapy: Patient Spontanous Breathing  Post-op Pain: mild  Post-op Assessment: Post-op Vital signs reviewed, Patient's Cardiovascular Status Stable, Respiratory Function Stable, Patent Airway, No signs of Nausea or vomiting and Pain level controlled  Post-op Vital Signs: Reviewed and stable  Complications: No apparent anesthesia complications

## 2013-06-13 NOTE — Anesthesia Procedure Notes (Signed)
Procedure Name: Intubation Date/Time: 06/13/2013 8:40 AM Performed by: Burna Cash Pre-anesthesia Checklist: Patient identified, Emergency Drugs available, Suction available and Patient being monitored Patient Re-evaluated:Patient Re-evaluated prior to inductionOxygen Delivery Method: Circle System Utilized Preoxygenation: Pre-oxygenation with 100% oxygen Intubation Type: IV induction Ventilation: Mask ventilation without difficulty Grade View: Grade I Tube type: Oral Tube size: 8.0 mm Number of attempts: 1 Airway Equipment and Method: stylet and oral airway Placement Confirmation: ETT inserted through vocal cords under direct vision,  positive ETCO2 and breath sounds checked- equal and bilateral Secured at: 23 cm Tube secured with: Tape Dental Injury: Teeth and Oropharynx as per pre-operative assessment

## 2013-06-14 ENCOUNTER — Encounter (HOSPITAL_BASED_OUTPATIENT_CLINIC_OR_DEPARTMENT_OTHER): Payer: Self-pay | Admitting: Oral Surgery

## 2014-10-17 ENCOUNTER — Other Ambulatory Visit: Payer: Self-pay | Admitting: Dermatology

## 2015-09-02 DIAGNOSIS — H401132 Primary open-angle glaucoma, bilateral, moderate stage: Secondary | ICD-10-CM | POA: Diagnosis not present

## 2015-09-11 ENCOUNTER — Telehealth: Payer: Self-pay | Admitting: Cardiovascular Disease

## 2015-09-11 DIAGNOSIS — E785 Hyperlipidemia, unspecified: Secondary | ICD-10-CM | POA: Diagnosis not present

## 2015-09-11 DIAGNOSIS — E039 Hypothyroidism, unspecified: Secondary | ICD-10-CM | POA: Diagnosis not present

## 2015-09-11 DIAGNOSIS — N183 Chronic kidney disease, stage 3 (moderate): Secondary | ICD-10-CM | POA: Diagnosis not present

## 2015-09-11 DIAGNOSIS — E559 Vitamin D deficiency, unspecified: Secondary | ICD-10-CM | POA: Diagnosis not present

## 2015-09-11 NOTE — Telephone Encounter (Signed)
09/11/2015 Received faxed referral packet from [facility] for upcoming appointment with Dr. Gwenlyn Found on 09/24/2015.  Records given to [facility].  cbr

## 2015-09-24 ENCOUNTER — Encounter: Payer: Self-pay | Admitting: Cardiovascular Disease

## 2015-09-24 ENCOUNTER — Ambulatory Visit (INDEPENDENT_AMBULATORY_CARE_PROVIDER_SITE_OTHER): Payer: Medicare HMO | Admitting: Cardiovascular Disease

## 2015-09-24 VITALS — BP 156/76 | HR 67 | Ht 68.0 in | Wt 163.0 lb

## 2015-09-24 DIAGNOSIS — R5383 Other fatigue: Secondary | ICD-10-CM

## 2015-09-24 DIAGNOSIS — E785 Hyperlipidemia, unspecified: Secondary | ICD-10-CM | POA: Diagnosis not present

## 2015-09-24 NOTE — Progress Notes (Signed)
09/24/2015 [name] [name]   1941-04-07  [id number]  Primary Physician [name] Sheller, MD Primary Cardiologist: [name] Harp MD [name] [name]   HPI:  [name] [name] is a delightful 75 year old thin and fit-appearing married Caucasian male father of 2, grandfather of 3 grandchildren referred by [name] [name] for cardiovascular evaluation to be established in our practice. His only cardiovascular risk factor is treated hyperlipidemia. His mother recently passed away from a myocardial infarction at age 64. He worked at [name] for 35 years remotely and is retired. He walks 6 days a week for an hour and drinks wine daily. He is otherwise asymptomatic. He has never had a heart attack or stroke.   Current Outpatient Prescriptions  Medication Sig Dispense Refill  . aspirin 81 MG tablet Take 81 mg by mouth daily.    Marland Kitchen atorvastatin (LIPITOR) 10 MG tablet Take 10 mg by mouth every evening.     . bimatoprost (LUMIGAN) 0.01 % SOLN Place 1 drop into both eyes at bedtime.     . brimonidine (ALPHAGAN) 0.2 % ophthalmic solution Place 1 drop into the left eye 2 (two) times daily.     . calcium carbonate (OS-CAL) 600 MG TABS tablet Take 600 mg by mouth 2 (two) times daily with a meal.    . cholecalciferol (VITAMIN D) 1000 UNITS tablet Take 1,000 Units by mouth daily.    . dorzolamide-timolol (COSOPT) 22.3-6.8 MG/ML ophthalmic solution Place 1 drop into both eyes 2 (two) times daily.      Marland Kitchen dutasteride (AVODART) 0.5 MG capsule Take 0.5 mg by mouth every evening.     . Multiple Vitamins-Minerals (MULTIVITAMIN WITH MINERALS) tablet Take 1 tablet by mouth daily.     No current facility-administered medications for this visit.    No Known Allergies  Social History   Social History  . Marital Status: Married    Spouse Name: N/A  . Number of Children: N/A  . Years of Education: N/A   Occupational History  . Not on file.   Social History Main Topics  . Smoking status: Former Smoker   Quit date: 09/21/1967  . Smokeless tobacco: Not on file  . Alcohol Use: 0.0 oz/week    1-2 Glasses of wine per week  . Drug Use: No  . Sexual Activity: Not on file   Other Topics Concern  . Not on file   Social History Narrative     Review of Systems: General: negative for chills, fever, night sweats or weight changes.  Cardiovascular: negative for chest pain, dyspnea on exertion, edema, orthopnea, palpitations, paroxysmal nocturnal dyspnea or shortness of breath Dermatological: negative for rash Respiratory: negative for cough or wheezing Urologic: negative for hematuria Abdominal: negative for nausea, vomiting, diarrhea, bright red blood per rectum, melena, or hematemesis Neurologic: negative for visual changes, syncope, or dizziness All other systems reviewed and are otherwise negative except as noted above.    Blood pressure 156/76, pulse 67, height 5\' 8"  (1.727 m), weight 163 lb (73.936 kg).  General appearance: alert and no distress Neck: no adenopathy, no carotid bruit, no JVD, supple, symmetrical, trachea midline and thyroid not enlarged, symmetric, no tenderness/mass/nodules Lungs: clear to auscultation bilaterally Heart: regular rate and rhythm, S1, S2 normal, no murmur, click, rub or gallop Extremities: extremities normal, atraumatic, no cyanosis or edema  EKG sinus rhythm at 67 without ST or T-wave changes. I personally reviewed this EKG  ASSESSMENT AND PLAN:   Hyperlipidemia History of hyperlipidemia on atorvastatin followed by his  PCP      [name] Harp MD [place], [place] 09/24/2015 4:30 PM

## 2015-09-24 NOTE — Assessment & Plan Note (Signed)
History of hyperlipidemia on atorvastatin followed by his PCP 

## 2015-09-24 NOTE — Patient Instructions (Signed)
Medication Instructions:  Your physician recommends that you continue on your current medications as directed. Please refer to the Current Medication list given to you today.   Labwork: none  Testing/Procedures: Your physician has requested that you have an exercise tolerance test. For further information please visit [email or web address]. Please also follow instruction sheet, as given.    Follow-Up: Follow up with Dr. Berry as needed.   Any Other Special Instructions Will Be Listed Below (If Applicable).     If you need a refill on your cardiac medications before your next appointment, please call your pharmacy.   

## 2015-10-07 ENCOUNTER — Telehealth (HOSPITAL_COMMUNITY): Payer: Self-pay

## 2015-10-07 NOTE — Telephone Encounter (Signed)
Encounter complete. 

## 2015-10-09 ENCOUNTER — Ambulatory Visit (HOSPITAL_COMMUNITY)
Admission: RE | Admit: 2015-10-09 | Discharge: 2015-10-09 | Disposition: A | Discharge status: Home / Self Care | Payer: Medicare HMO | Source: Ambulatory Visit | Attending: Cardiovascular Disease | Admitting: Cardiovascular Disease

## 2015-10-09 DIAGNOSIS — R5383 Other fatigue: Secondary | ICD-10-CM

## 2015-10-09 DIAGNOSIS — E785 Hyperlipidemia, unspecified: Secondary | ICD-10-CM | POA: Diagnosis not present

## 2015-10-09 LAB — EXERCISE TOLERANCE TEST
CHL RATE OF PERCEIVED EXERTION: 17
CSEPEDS: 0 s
CSEPHR: 103 %
Estimated workload: 10.1 METS
Exercise duration (min): 9 min
MPHR: 146 {beats}/min
Peak HR: 151 {beats}/min
Rest HR: 62 {beats}/min

## 2015-12-08 DIAGNOSIS — E559 Vitamin D deficiency, unspecified: Secondary | ICD-10-CM | POA: Diagnosis not present

## 2015-12-08 DIAGNOSIS — E785 Hyperlipidemia, unspecified: Secondary | ICD-10-CM | POA: Diagnosis not present

## 2015-12-08 DIAGNOSIS — Z125 Encounter for screening for malignant neoplasm of prostate: Secondary | ICD-10-CM | POA: Diagnosis not present

## 2015-12-15 DIAGNOSIS — Z0001 Encounter for general adult medical examination with abnormal findings: Secondary | ICD-10-CM | POA: Diagnosis not present

## 2015-12-15 DIAGNOSIS — E559 Vitamin D deficiency, unspecified: Secondary | ICD-10-CM | POA: Diagnosis not present

## 2015-12-15 DIAGNOSIS — E785 Hyperlipidemia, unspecified: Secondary | ICD-10-CM | POA: Diagnosis not present

## 2015-12-15 DIAGNOSIS — Z1389 Encounter for screening for other disorder: Secondary | ICD-10-CM | POA: Diagnosis not present

## 2015-12-15 DIAGNOSIS — E039 Hypothyroidism, unspecified: Secondary | ICD-10-CM | POA: Diagnosis not present

## 2015-12-15 DIAGNOSIS — N183 Chronic kidney disease, stage 3 (moderate): Secondary | ICD-10-CM | POA: Diagnosis not present

## 2015-12-15 DIAGNOSIS — E663 Overweight: Secondary | ICD-10-CM | POA: Diagnosis not present

## 2015-12-30 DIAGNOSIS — N138 Other obstructive and reflux uropathy: Secondary | ICD-10-CM | POA: Diagnosis not present

## 2015-12-30 DIAGNOSIS — N401 Enlarged prostate with lower urinary tract symptoms: Secondary | ICD-10-CM | POA: Diagnosis not present

## 2015-12-30 DIAGNOSIS — R351 Nocturia: Secondary | ICD-10-CM | POA: Diagnosis not present

## 2015-12-30 DIAGNOSIS — Z0000 Encounter for general adult medical examination without abnormal findings: Secondary | ICD-10-CM | POA: Diagnosis not present

## 2016-01-06 DIAGNOSIS — Z961 Presence of intraocular lens: Secondary | ICD-10-CM | POA: Diagnosis not present

## 2016-01-06 DIAGNOSIS — D3102 Benign neoplasm of left conjunctiva: Secondary | ICD-10-CM | POA: Diagnosis not present

## 2016-01-06 DIAGNOSIS — H401111 Primary open-angle glaucoma, right eye, mild stage: Secondary | ICD-10-CM | POA: Diagnosis not present

## 2016-01-06 DIAGNOSIS — H401122 Primary open-angle glaucoma, left eye, moderate stage: Secondary | ICD-10-CM | POA: Diagnosis not present

## 2016-03-02 DIAGNOSIS — R197 Diarrhea, unspecified: Secondary | ICD-10-CM | POA: Diagnosis not present

## 2016-04-16 ENCOUNTER — Other Ambulatory Visit: Payer: Self-pay | Admitting: Internal Medicine

## 2016-04-16 ENCOUNTER — Ambulatory Visit (INDEPENDENT_AMBULATORY_CARE_PROVIDER_SITE_OTHER): Payer: Medicare HMO | Admitting: Internal Medicine

## 2016-04-16 DIAGNOSIS — Z9189 Other specified personal risk factors, not elsewhere classified: Secondary | ICD-10-CM

## 2016-04-16 MED ORDER — ACETAZOLAMIDE 125 MG PO TABS: 125.0000 mg | ORAL_TABLET | Freq: Two times a day (BID) | ORAL | 0 refills | Status: AC

## 2016-04-16 MED ORDER — AZITHROMYCIN 500 MG PO TABS
500.0000 mg | ORAL_TABLET | Freq: Every day | ORAL | 0 refills | Status: AC
Start: 2016-04-16 — End: ?

## 2016-04-16 MED ORDER — METRONIDAZOLE 500 MG PO TABS: 500.0000 mg | ORAL_TABLET | Freq: Three times a day (TID) | ORAL | 0 refills | Status: AC

## 2016-04-16 NOTE — Progress Notes (Signed)
  RFV: pretravel counseling for trip to [place] and [place] Subjective:    Patient ID: [name] [name], male    DOB: April 25, 1941, 75 y.o.   MRN: [id number]  HPI  Mr. [name] [name] is a 75yo M in good state of health, with extensive travel history including recently going to [place] for 4 wk. He and his wife will be going to [place] to [place] and [place] from Nov 18 th through Dec 3rd. They have several question to whether they should go to [facility] and get yellow fever. Concern for their age and YF vaccine side effects.  On his trip to [place], just prior to going he was receiving clindamycin for dental abscess. He then developed diarreha on his trip, that was diagnosed as cdifficile upon his return. Treated with probiotics and metronidazole. He is now free of symptoms. He has not had further abtx  Past travel hx: [place], [place], [place], [place], [place], [place], [place], [place], [place], [place]  They have been up to 9000 nad 10000 ft without difficulty in the [place]  The itinerary is [place] on 17-18th then go to [place] on 19[place]20th, m.p. On 21, then back to [place] on 22-23-24, then on 25th onto [place]  Prior vaccine = uptodate on oral typhoid, hep a, influenza, tdap No Known Allergies Current Outpatient Prescriptions on File Prior to Visit  Medication Sig Dispense Refill  . aspirin 81 MG tablet Take 81 mg by mouth daily.    Marland Kitchen atorvastatin (LIPITOR) 10 MG tablet Take 10 mg by mouth every evening.     . bimatoprost (LUMIGAN) 0.01 % SOLN Place 1 drop into both eyes at bedtime.     . brimonidine (ALPHAGAN) 0.2 % ophthalmic solution Place 1 drop into the left eye 2 (two) times daily.     . calcium carbonate (OS-CAL) 600 MG TABS tablet Take 600 mg by mouth 2 (two) times daily with a meal.    . cholecalciferol (VITAMIN D) 1000 UNITS tablet Take 1,000 Units by mouth daily.    . dorzolamide-timolol (COSOPT) 22.3-6.8 MG/ML ophthalmic solution Place 1 drop into both eyes 2 (two) times daily.       Marland Kitchen dutasteride (AVODART) 0.5 MG capsule Take 0.5 mg by mouth every evening.     . Multiple Vitamins-Minerals (MULTIVITAMIN WITH MINERALS) tablet Take 1 tablet by mouth daily.     No current facility-administered medications on file prior to visit.    Active Ambulatory Problems    Diagnosis Date Noted  . Osteoarthritis bilateral knees 08/04/2011  . Hyperlipidemia 09/24/2015   Resolved Ambulatory Problems    Diagnosis Date Noted  . No Resolved Ambulatory Problems   Past Medical History:  Diagnosis Date  . Arthritis   . Elevated cholesterol   . Enlarged prostate   . Glaucoma   . Hearing loss   . Hiatal hernia      Review of Systems     Objective:   Physical Exam        Assessment & Plan:  Pre travel vaccination = no need for additional vaccines at this time  Traveler's diarrhea = recommend to take pepto bismal. Will give rx for azithromycin and metronidazole to use if needed he is at high risk for cdiff recurrence, thus also giving him rx for metronidazole. Avoid cipro  Altitude sickness = will give rx for diamox

## 2016-06-09 DIAGNOSIS — N400 Enlarged prostate without lower urinary tract symptoms: Secondary | ICD-10-CM | POA: Diagnosis not present

## 2016-06-09 DIAGNOSIS — E785 Hyperlipidemia, unspecified: Secondary | ICD-10-CM | POA: Diagnosis not present

## 2016-06-09 DIAGNOSIS — E559 Vitamin D deficiency, unspecified: Secondary | ICD-10-CM | POA: Diagnosis not present

## 2016-06-10 DIAGNOSIS — D1801 Hemangioma of skin and subcutaneous tissue: Secondary | ICD-10-CM | POA: Diagnosis not present

## 2016-06-10 DIAGNOSIS — L821 Other seborrheic keratosis: Secondary | ICD-10-CM | POA: Diagnosis not present

## 2016-06-10 DIAGNOSIS — Z85828 Personal history of other malignant neoplasm of skin: Secondary | ICD-10-CM | POA: Diagnosis not present

## 2016-06-10 DIAGNOSIS — L570 Actinic keratosis: Secondary | ICD-10-CM | POA: Diagnosis not present

## 2016-06-10 DIAGNOSIS — D692 Other nonthrombocytopenic purpura: Secondary | ICD-10-CM | POA: Diagnosis not present

## 2016-06-10 DIAGNOSIS — L820 Inflamed seborrheic keratosis: Secondary | ICD-10-CM | POA: Diagnosis not present

## 2016-06-16 DIAGNOSIS — N183 Chronic kidney disease, stage 3 (moderate): Secondary | ICD-10-CM | POA: Diagnosis not present

## 2016-06-16 DIAGNOSIS — Z0000 Encounter for general adult medical examination without abnormal findings: Secondary | ICD-10-CM | POA: Diagnosis not present

## 2016-06-16 DIAGNOSIS — Z23 Encounter for immunization: Secondary | ICD-10-CM | POA: Diagnosis not present

## 2016-06-16 DIAGNOSIS — E785 Hyperlipidemia, unspecified: Secondary | ICD-10-CM | POA: Diagnosis not present

## 2016-06-16 DIAGNOSIS — E039 Hypothyroidism, unspecified: Secondary | ICD-10-CM | POA: Diagnosis not present

## 2016-06-29 DIAGNOSIS — H401111 Primary open-angle glaucoma, right eye, mild stage: Secondary | ICD-10-CM | POA: Diagnosis not present

## 2016-06-29 DIAGNOSIS — H401122 Primary open-angle glaucoma, left eye, moderate stage: Secondary | ICD-10-CM | POA: Diagnosis not present

## 2016-07-02 DIAGNOSIS — Z23 Encounter for immunization: Secondary | ICD-10-CM | POA: Diagnosis not present

## 2016-12-06 DIAGNOSIS — H401122 Primary open-angle glaucoma, left eye, moderate stage: Secondary | ICD-10-CM | POA: Diagnosis not present

## 2016-12-06 DIAGNOSIS — Z961 Presence of intraocular lens: Secondary | ICD-10-CM | POA: Diagnosis not present

## 2016-12-06 DIAGNOSIS — H401111 Primary open-angle glaucoma, right eye, mild stage: Secondary | ICD-10-CM | POA: Diagnosis not present

## 2016-12-09 DIAGNOSIS — Z125 Encounter for screening for malignant neoplasm of prostate: Secondary | ICD-10-CM | POA: Diagnosis not present

## 2016-12-09 DIAGNOSIS — E039 Hypothyroidism, unspecified: Secondary | ICD-10-CM | POA: Diagnosis not present

## 2016-12-09 DIAGNOSIS — E559 Vitamin D deficiency, unspecified: Secondary | ICD-10-CM | POA: Diagnosis not present

## 2016-12-09 DIAGNOSIS — Z0000 Encounter for general adult medical examination without abnormal findings: Secondary | ICD-10-CM | POA: Diagnosis not present

## 2016-12-22 DIAGNOSIS — N183 Chronic kidney disease, stage 3 (moderate): Secondary | ICD-10-CM | POA: Diagnosis not present

## 2016-12-22 DIAGNOSIS — E785 Hyperlipidemia, unspecified: Secondary | ICD-10-CM | POA: Diagnosis not present

## 2016-12-22 DIAGNOSIS — Z0000 Encounter for general adult medical examination without abnormal findings: Secondary | ICD-10-CM | POA: Diagnosis not present

## 2016-12-22 DIAGNOSIS — Z23 Encounter for immunization: Secondary | ICD-10-CM | POA: Diagnosis not present

## 2016-12-22 DIAGNOSIS — R69 Illness, unspecified: Secondary | ICD-10-CM | POA: Diagnosis not present

## 2017-01-12 DIAGNOSIS — R351 Nocturia: Secondary | ICD-10-CM | POA: Diagnosis not present

## 2017-01-12 DIAGNOSIS — N5201 Erectile dysfunction due to arterial insufficiency: Secondary | ICD-10-CM | POA: Diagnosis not present

## 2017-01-12 DIAGNOSIS — N401 Enlarged prostate with lower urinary tract symptoms: Secondary | ICD-10-CM | POA: Diagnosis not present

## 2017-03-21 DIAGNOSIS — H401111 Primary open-angle glaucoma, right eye, mild stage: Secondary | ICD-10-CM | POA: Diagnosis not present

## 2017-03-21 DIAGNOSIS — H401122 Primary open-angle glaucoma, left eye, moderate stage: Secondary | ICD-10-CM | POA: Diagnosis not present

## 2017-04-15 ENCOUNTER — Ambulatory Visit (INDEPENDENT_AMBULATORY_CARE_PROVIDER_SITE_OTHER): Payer: Medicare HMO | Admitting: Internal Medicine

## 2017-04-15 DIAGNOSIS — Z9189 Other specified personal risk factors, not elsewhere classified: Secondary | ICD-10-CM

## 2017-04-15 DIAGNOSIS — Z7189 Other specified counseling: Secondary | ICD-10-CM

## 2017-04-15 DIAGNOSIS — Z7184 Encounter for health counseling related to travel: Secondary | ICD-10-CM

## 2017-04-15 DIAGNOSIS — Z789 Other specified health status: Secondary | ICD-10-CM

## 2017-04-15 MED ORDER — ATOVAQUONE-PROGUANIL HCL 250-100 MG PO TABS: 1.0000 | ORAL_TABLET | Freq: Every day | ORAL | 0 refills | Status: DC

## 2017-04-15 MED ORDER — ATOVAQUONE-PROGUANIL HCL 250-100 MG PO TABS: 1.0000 | ORAL_TABLET | Freq: Every day | ORAL | 0 refills | Status: AC

## 2017-04-15 NOTE — Progress Notes (Signed)
Subjective:   [name] [name] is a 76 y.o. male who presents to the Infectious Disease clinic for travel consultation. Planned departure date: sept 4th          Planned return date: sept 26th Countries of travel: [place], [place], [place] Areas in country[place]ommodations[place] hotel[place] and [place] Purpose of travel: vacation Prior travel out of [place]:  Yes - [place], [place], [place] and [place] last year     Objective:   Medications: reviewed    Assessment:   No contraindications to travel. none   Plan:    Pre travel vaccines = no additional vaccines needed on this trip though oral typhoid to expire at end of the year. He is to check with PCP if his TDAP is uptodate  Malaria prophylaxis = will give precautions. Use premethrin for clothing, deet for skin. Gave rx for  malarone #30 and instructions on how to take medications  Traveler's diarrhea = can use rx for azithromycin and/or metronidazole if needed from last trip. Had hx of cdifficile roughly 2 yrs ago.

## 2017-04-27 DIAGNOSIS — H401111 Primary open-angle glaucoma, right eye, mild stage: Secondary | ICD-10-CM | POA: Diagnosis not present

## 2017-04-27 DIAGNOSIS — H401122 Primary open-angle glaucoma, left eye, moderate stage: Secondary | ICD-10-CM | POA: Diagnosis not present

## 2017-05-13 DIAGNOSIS — H60502 Unspecified acute noninfective otitis externa, left ear: Secondary | ICD-10-CM | POA: Diagnosis not present

## 2017-06-21 DIAGNOSIS — E785 Hyperlipidemia, unspecified: Secondary | ICD-10-CM | POA: Diagnosis not present

## 2017-07-01 DIAGNOSIS — Z23 Encounter for immunization: Secondary | ICD-10-CM | POA: Diagnosis not present

## 2017-08-01 DIAGNOSIS — H401122 Primary open-angle glaucoma, left eye, moderate stage: Secondary | ICD-10-CM | POA: Diagnosis not present

## 2017-08-01 DIAGNOSIS — H401111 Primary open-angle glaucoma, right eye, mild stage: Secondary | ICD-10-CM | POA: Diagnosis not present

## 2017-08-17 DIAGNOSIS — H401122 Primary open-angle glaucoma, left eye, moderate stage: Secondary | ICD-10-CM | POA: Diagnosis not present

## 2017-10-03 DIAGNOSIS — H401111 Primary open-angle glaucoma, right eye, mild stage: Secondary | ICD-10-CM | POA: Diagnosis not present

## 2017-10-03 DIAGNOSIS — H401122 Primary open-angle glaucoma, left eye, moderate stage: Secondary | ICD-10-CM | POA: Diagnosis not present

## 2017-12-28 DIAGNOSIS — E039 Hypothyroidism, unspecified: Secondary | ICD-10-CM | POA: Diagnosis not present

## 2017-12-28 DIAGNOSIS — H409 Unspecified glaucoma: Secondary | ICD-10-CM | POA: Diagnosis not present

## 2017-12-28 DIAGNOSIS — N400 Enlarged prostate without lower urinary tract symptoms: Secondary | ICD-10-CM | POA: Diagnosis not present

## 2017-12-28 DIAGNOSIS — E785 Hyperlipidemia, unspecified: Secondary | ICD-10-CM | POA: Diagnosis not present

## 2017-12-28 DIAGNOSIS — R030 Elevated blood-pressure reading, without diagnosis of hypertension: Secondary | ICD-10-CM | POA: Diagnosis not present

## 2018-03-31 DIAGNOSIS — H401122 Primary open-angle glaucoma, left eye, moderate stage: Secondary | ICD-10-CM | POA: Diagnosis not present

## 2018-03-31 DIAGNOSIS — H401111 Primary open-angle glaucoma, right eye, mild stage: Secondary | ICD-10-CM | POA: Diagnosis not present

## 2018-03-31 DIAGNOSIS — Z961 Presence of intraocular lens: Secondary | ICD-10-CM | POA: Diagnosis not present

## 2018-03-31 DIAGNOSIS — H26491 Other secondary cataract, right eye: Secondary | ICD-10-CM | POA: Diagnosis not present

## 2018-06-20 DIAGNOSIS — E559 Vitamin D deficiency, unspecified: Secondary | ICD-10-CM | POA: Diagnosis not present

## 2018-06-20 DIAGNOSIS — E039 Hypothyroidism, unspecified: Secondary | ICD-10-CM | POA: Diagnosis not present

## 2018-06-20 DIAGNOSIS — N400 Enlarged prostate without lower urinary tract symptoms: Secondary | ICD-10-CM | POA: Diagnosis not present

## 2018-06-20 DIAGNOSIS — E785 Hyperlipidemia, unspecified: Secondary | ICD-10-CM | POA: Diagnosis not present

## 2018-06-20 DIAGNOSIS — Z0000 Encounter for general adult medical examination without abnormal findings: Secondary | ICD-10-CM | POA: Diagnosis not present

## 2018-06-27 DIAGNOSIS — H409 Unspecified glaucoma: Secondary | ICD-10-CM | POA: Diagnosis not present

## 2018-06-27 DIAGNOSIS — Z0000 Encounter for general adult medical examination without abnormal findings: Secondary | ICD-10-CM | POA: Diagnosis not present

## 2018-06-27 DIAGNOSIS — E559 Vitamin D deficiency, unspecified: Secondary | ICD-10-CM | POA: Diagnosis not present

## 2018-06-27 DIAGNOSIS — Z23 Encounter for immunization: Secondary | ICD-10-CM | POA: Diagnosis not present

## 2018-06-27 DIAGNOSIS — N400 Enlarged prostate without lower urinary tract symptoms: Secondary | ICD-10-CM | POA: Diagnosis not present

## 2018-06-27 DIAGNOSIS — E785 Hyperlipidemia, unspecified: Secondary | ICD-10-CM | POA: Diagnosis not present

## 2018-06-29 DIAGNOSIS — R3915 Urgency of urination: Secondary | ICD-10-CM | POA: Diagnosis not present

## 2018-06-29 DIAGNOSIS — N401 Enlarged prostate with lower urinary tract symptoms: Secondary | ICD-10-CM | POA: Diagnosis not present

## 2018-06-29 DIAGNOSIS — N5201 Erectile dysfunction due to arterial insufficiency: Secondary | ICD-10-CM | POA: Diagnosis not present

## 2018-08-30 DIAGNOSIS — H401122 Primary open-angle glaucoma, left eye, moderate stage: Secondary | ICD-10-CM | POA: Diagnosis not present

## 2018-08-30 DIAGNOSIS — H401111 Primary open-angle glaucoma, right eye, mild stage: Secondary | ICD-10-CM | POA: Diagnosis not present

## 2018-09-06 ENCOUNTER — Other Ambulatory Visit: Payer: Self-pay | Admitting: Family Medicine

## 2018-09-06 DIAGNOSIS — I6529 Occlusion and stenosis of unspecified carotid artery: Secondary | ICD-10-CM

## 2018-09-11 ENCOUNTER — Other Ambulatory Visit: Payer: Medicare HMO

## 2018-09-15 ENCOUNTER — Ambulatory Visit
Admission: RE | Admit: 2018-09-15 | Discharge: 2018-09-15 | Disposition: A | Discharge status: Home / Self Care | Payer: Medicare HMO | Source: Ambulatory Visit | Attending: Family Medicine | Admitting: Family Medicine

## 2018-09-15 DIAGNOSIS — I6529 Occlusion and stenosis of unspecified carotid artery: Secondary | ICD-10-CM

## 2018-09-15 DIAGNOSIS — I6523 Occlusion and stenosis of bilateral carotid arteries: Secondary | ICD-10-CM | POA: Diagnosis not present

## 2019-02-07 DIAGNOSIS — H401122 Primary open-angle glaucoma, left eye, moderate stage: Secondary | ICD-10-CM | POA: Diagnosis not present

## 2019-02-07 DIAGNOSIS — Z961 Presence of intraocular lens: Secondary | ICD-10-CM | POA: Diagnosis not present

## 2019-02-07 DIAGNOSIS — H26491 Other secondary cataract, right eye: Secondary | ICD-10-CM | POA: Diagnosis not present

## 2019-02-07 DIAGNOSIS — H401111 Primary open-angle glaucoma, right eye, mild stage: Secondary | ICD-10-CM | POA: Diagnosis not present

## 2019-06-13 DIAGNOSIS — H401122 Primary open-angle glaucoma, left eye, moderate stage: Secondary | ICD-10-CM | POA: Diagnosis not present

## 2019-06-13 DIAGNOSIS — H401111 Primary open-angle glaucoma, right eye, mild stage: Secondary | ICD-10-CM | POA: Diagnosis not present

## 2019-06-26 DIAGNOSIS — R69 Illness, unspecified: Secondary | ICD-10-CM | POA: Diagnosis not present

## 2019-06-27 DIAGNOSIS — C44319 Basal cell carcinoma of skin of other parts of face: Secondary | ICD-10-CM | POA: Diagnosis not present

## 2019-06-27 DIAGNOSIS — D485 Neoplasm of uncertain behavior of skin: Secondary | ICD-10-CM | POA: Diagnosis not present

## 2019-06-27 DIAGNOSIS — D1801 Hemangioma of skin and subcutaneous tissue: Secondary | ICD-10-CM | POA: Diagnosis not present

## 2019-06-27 DIAGNOSIS — L821 Other seborrheic keratosis: Secondary | ICD-10-CM | POA: Diagnosis not present

## 2019-06-27 DIAGNOSIS — Z85828 Personal history of other malignant neoplasm of skin: Secondary | ICD-10-CM | POA: Diagnosis not present

## 2019-06-27 DIAGNOSIS — L918 Other hypertrophic disorders of the skin: Secondary | ICD-10-CM | POA: Diagnosis not present

## 2019-06-27 DIAGNOSIS — L570 Actinic keratosis: Secondary | ICD-10-CM | POA: Diagnosis not present

## 2019-07-09 DIAGNOSIS — R946 Abnormal results of thyroid function studies: Secondary | ICD-10-CM | POA: Diagnosis not present

## 2019-07-09 DIAGNOSIS — E785 Hyperlipidemia, unspecified: Secondary | ICD-10-CM | POA: Diagnosis not present

## 2019-07-09 DIAGNOSIS — E039 Hypothyroidism, unspecified: Secondary | ICD-10-CM | POA: Diagnosis not present

## 2019-07-09 DIAGNOSIS — Z0000 Encounter for general adult medical examination without abnormal findings: Secondary | ICD-10-CM | POA: Diagnosis not present

## 2019-07-09 DIAGNOSIS — E559 Vitamin D deficiency, unspecified: Secondary | ICD-10-CM | POA: Diagnosis not present

## 2019-07-09 DIAGNOSIS — R7989 Other specified abnormal findings of blood chemistry: Secondary | ICD-10-CM | POA: Diagnosis not present

## 2019-07-09 DIAGNOSIS — N400 Enlarged prostate without lower urinary tract symptoms: Secondary | ICD-10-CM | POA: Diagnosis not present

## 2019-07-12 DIAGNOSIS — Z0000 Encounter for general adult medical examination without abnormal findings: Secondary | ICD-10-CM | POA: Diagnosis not present

## 2019-07-12 DIAGNOSIS — R946 Abnormal results of thyroid function studies: Secondary | ICD-10-CM | POA: Diagnosis not present

## 2019-07-12 DIAGNOSIS — E785 Hyperlipidemia, unspecified: Secondary | ICD-10-CM | POA: Diagnosis not present

## 2019-07-12 DIAGNOSIS — H409 Unspecified glaucoma: Secondary | ICD-10-CM | POA: Diagnosis not present

## 2019-07-12 DIAGNOSIS — R69 Illness, unspecified: Secondary | ICD-10-CM | POA: Diagnosis not present

## 2019-07-12 DIAGNOSIS — E559 Vitamin D deficiency, unspecified: Secondary | ICD-10-CM | POA: Diagnosis not present

## 2019-07-12 DIAGNOSIS — N400 Enlarged prostate without lower urinary tract symptoms: Secondary | ICD-10-CM | POA: Diagnosis not present

## 2019-07-16 DIAGNOSIS — R3915 Urgency of urination: Secondary | ICD-10-CM | POA: Diagnosis not present

## 2019-07-16 DIAGNOSIS — N401 Enlarged prostate with lower urinary tract symptoms: Secondary | ICD-10-CM | POA: Diagnosis not present

## 2019-07-18 DIAGNOSIS — E782 Mixed hyperlipidemia: Secondary | ICD-10-CM | POA: Diagnosis not present

## 2019-07-18 DIAGNOSIS — R69 Illness, unspecified: Secondary | ICD-10-CM | POA: Diagnosis not present

## 2019-07-18 DIAGNOSIS — K5730 Diverticulosis of large intestine without perforation or abscess without bleeding: Secondary | ICD-10-CM | POA: Diagnosis not present

## 2019-10-01 ENCOUNTER — Ambulatory Visit: Payer: Medicare Other | Attending: Internal Medicine

## 2019-10-01 DIAGNOSIS — Z23 Encounter for immunization: Secondary | ICD-10-CM

## 2019-10-01 NOTE — Progress Notes (Signed)
   Covid-19 Vaccination Clinic  Name:  [name] [name]    MRN: [id number] DOB: [birth year]  10/01/2019  Mr. Quesnel was observed post Covid-19 immunization for 15 minutes without incidence. He was provided with Vaccine Information Sheet and instruction to access the V-Safe system.   Mr. Hathorne was instructed to call 911 with any severe reactions post vaccine: Marland Kitchen Difficulty breathing  . Swelling of your face and throat  . A fast heartbeat  . A bad rash all over your body  . Dizziness and weakness

## 2019-10-08 DIAGNOSIS — Z0000 Encounter for general adult medical examination without abnormal findings: Secondary | ICD-10-CM | POA: Diagnosis not present

## 2019-10-08 DIAGNOSIS — E039 Hypothyroidism, unspecified: Secondary | ICD-10-CM | POA: Diagnosis not present

## 2019-10-21 ENCOUNTER — Ambulatory Visit: Payer: Medicare HMO | Attending: Internal Medicine

## 2019-10-21 DIAGNOSIS — Z23 Encounter for immunization: Secondary | ICD-10-CM | POA: Insufficient documentation

## 2019-10-21 NOTE — Progress Notes (Signed)
   Covid-19 Vaccination Clinic  Name:  [name] [name]    MRN: [id number] DOB: [birth year]  10/21/2019  [name] was observed post Covid-19 immunization for 15 minutes without incidence. He was provided with Vaccine Information Sheet and instruction to access the V-Safe system.   [name] was instructed to call 911 with any severe reactions post vaccine: Marland Kitchen Difficulty breathing  . Swelling of your face and throat  . A fast heartbeat  . A bad rash all over your body  . Dizziness and weakness    Immunizations Administered    Name Date Dose VIS Date Route   Pfizer COVID-19 Vaccine 10/21/2019  1:16 PM 0.3 mL 08/31/2019 Intramuscular   Manufacturer: [employer]   Lot: [id number]   [place]: [id number]

## 2019-12-11 DIAGNOSIS — H401111 Primary open-angle glaucoma, right eye, mild stage: Secondary | ICD-10-CM | POA: Diagnosis not present

## 2019-12-11 DIAGNOSIS — H26491 Other secondary cataract, right eye: Secondary | ICD-10-CM | POA: Diagnosis not present

## 2019-12-11 DIAGNOSIS — H401122 Primary open-angle glaucoma, left eye, moderate stage: Secondary | ICD-10-CM | POA: Diagnosis not present

## 2020-01-14 DIAGNOSIS — E785 Hyperlipidemia, unspecified: Secondary | ICD-10-CM | POA: Diagnosis not present

## 2020-01-14 DIAGNOSIS — E039 Hypothyroidism, unspecified: Secondary | ICD-10-CM | POA: Diagnosis not present

## 2020-01-15 IMAGING — US US CAROTID DUPLEX BILAT
1 series · 13 of 24 positions shown · non-contrast
Comparison: None.

CLINICAL DATA: Carotid artery calcification

EXAM:
BILATERAL CAROTID DUPLEX ULTRASOUND
TECHNIQUE: Gray scale imaging, color Doppler and duplex ultrasound were
performed of bilateral carotid and vertebral arteries in the neck.

[Series 1: us carotid duplex bilat · 0.06mm/px · 13 of 46 slices shown]
[im 1/46]
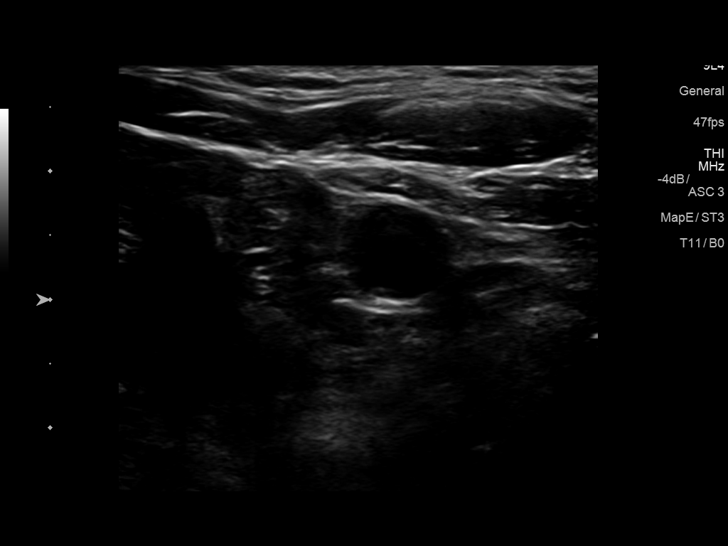
[im 4/46]
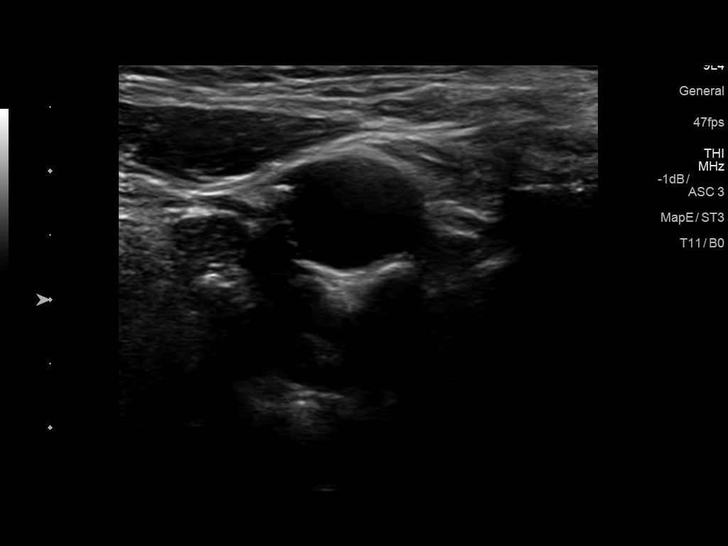
[im 8/46]
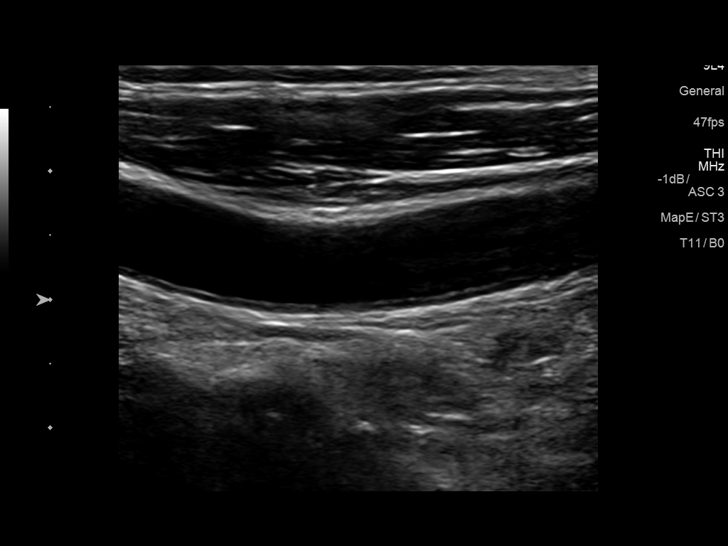
[im 12/46]
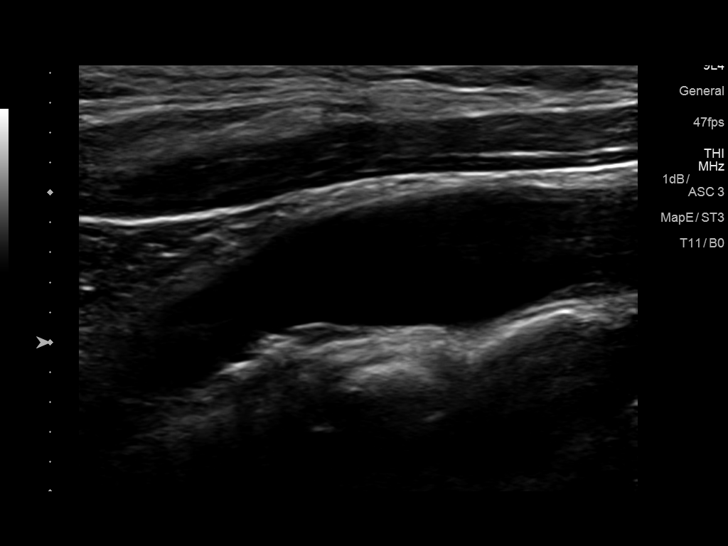
[im 16/46]
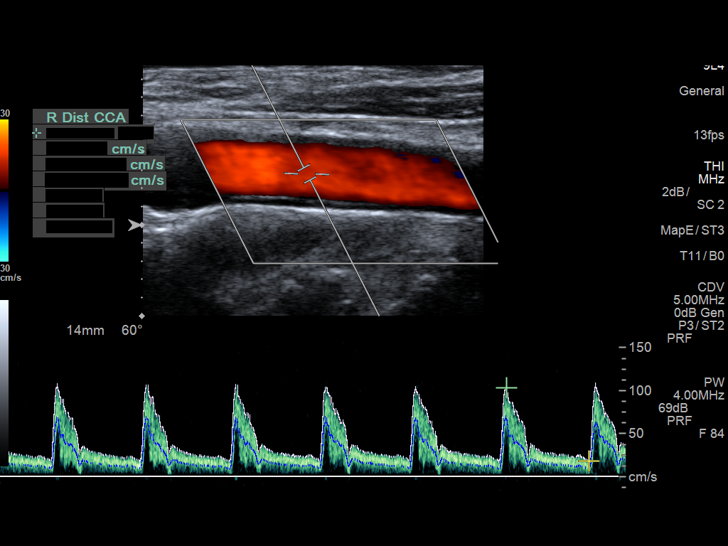
[im 20/46]
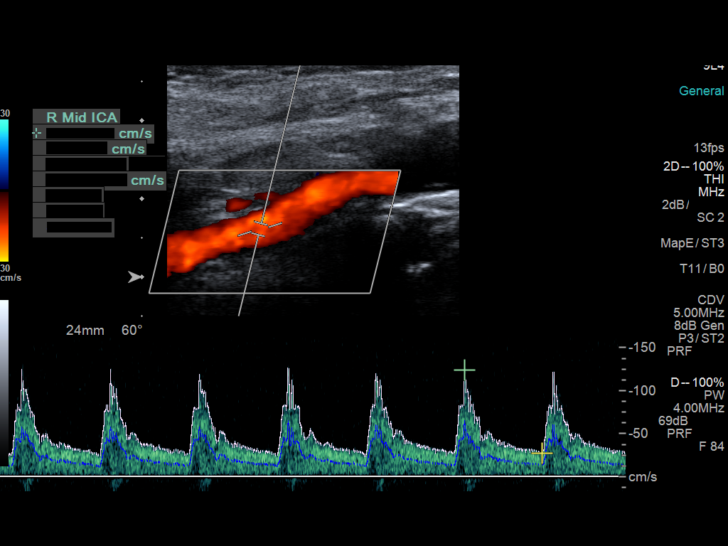
[im 24/46]
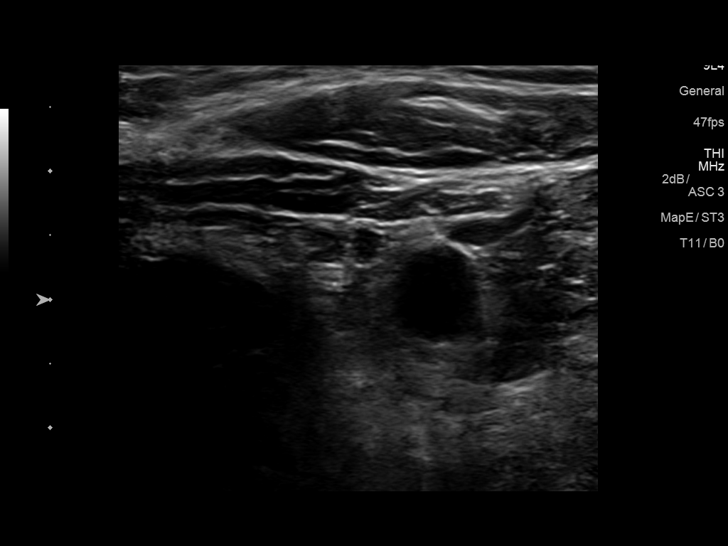
[im 26/46]
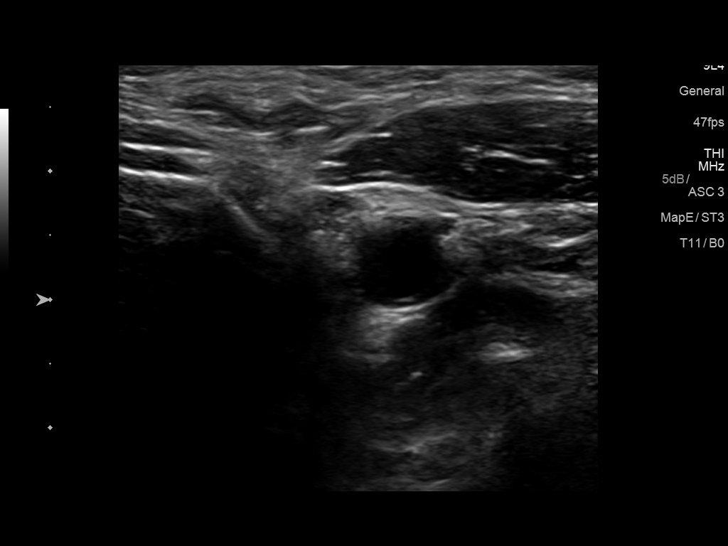
[im 30/46]
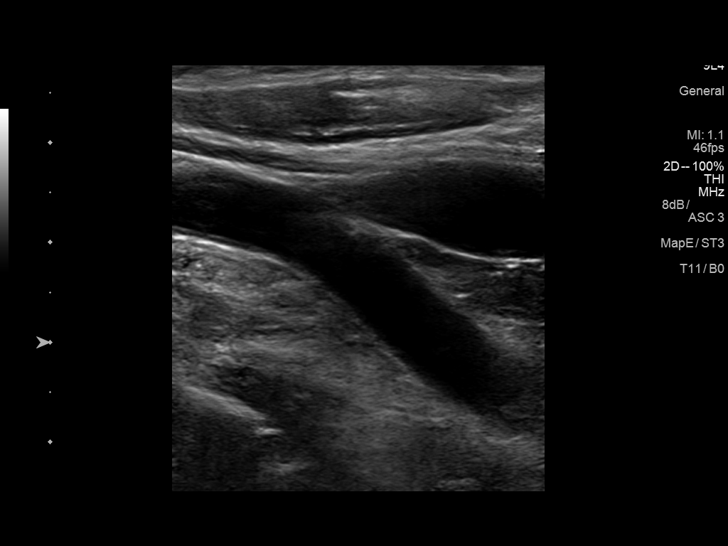
[im 34/46]
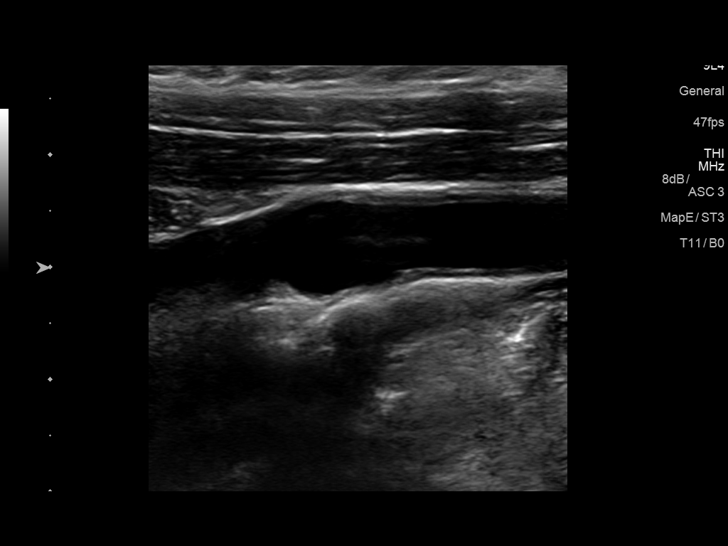
[im 38/46]
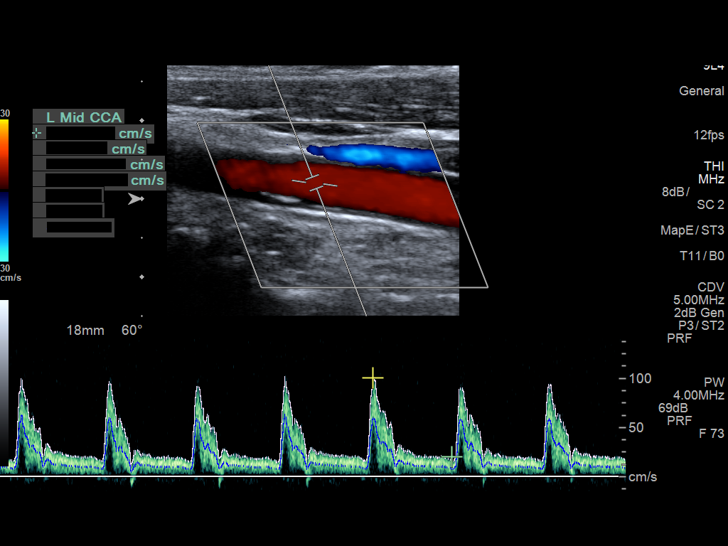
[im 42/46]
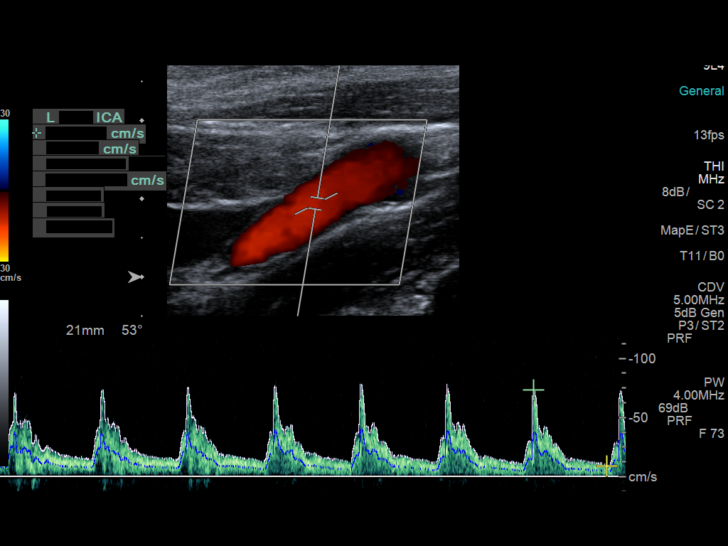
[im 46/46]
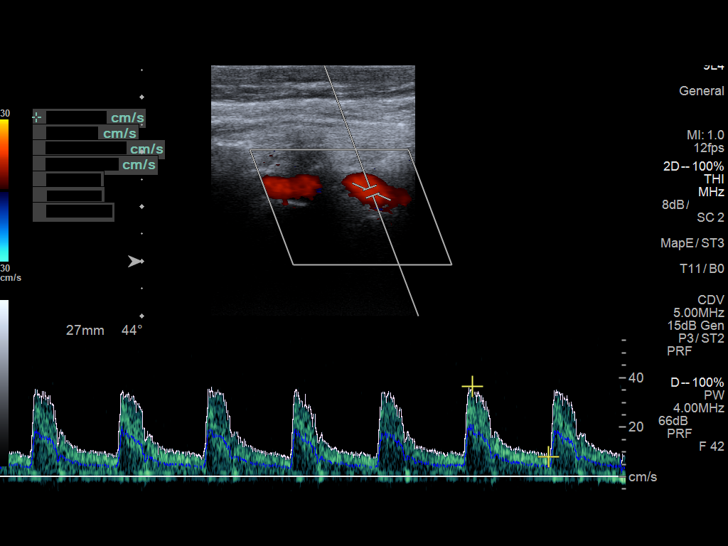

[13 of 24 positions shown; findings below may reference images not displayed]

FINDINGS: Criteria: Quantification of carotid stenosis is based on velocity
parameters that correlate the residual internal carotid diameter
with NASCET-based stenosis levels, using the diameter of the distal
internal carotid lumen as the denominator for stenosis measurement.

The following velocity measurements were obtained:

RIGHT

ICA: 124 cm/sec

CCA: 107 cm/sec

SYSTOLIC ICA/CCA RATIO:

ECA: 95 cm/sec

LEFT

ICA: 73 cm/sec

CCA: 101 cm/sec

SYSTOLIC ICA/CCA RATIO:

ECA: 85 cm/sec

RIGHT CAROTID ARTERY: Little if any plaque in the bulb. Minimal
calcified plaque in the proximal external carotid artery. Minimal
calcified plaque in the proximal internal carotid artery. Low
resistance internal carotid Doppler pattern.

RIGHT VERTEBRAL ARTERY:  Antegrade.

LEFT CAROTID ARTERY: Mild irregular calcified plaque in the mid
common carotid artery and bulb. Low resistance internal carotid
Doppler pattern is preserved.

LEFT VERTEBRAL ARTERY:  Antegrade.

Upper extremity blood pressures: RIGHT: 145/78 LEFT: 150/81
IMPRESSION: Less than 50% stenosis in the right and left internal carotid
arteries.

## 2020-01-16 DIAGNOSIS — R69 Illness, unspecified: Secondary | ICD-10-CM | POA: Diagnosis not present

## 2020-01-16 DIAGNOSIS — E785 Hyperlipidemia, unspecified: Secondary | ICD-10-CM | POA: Diagnosis not present

## 2020-01-16 DIAGNOSIS — R946 Abnormal results of thyroid function studies: Secondary | ICD-10-CM | POA: Diagnosis not present

## 2020-01-16 DIAGNOSIS — N400 Enlarged prostate without lower urinary tract symptoms: Secondary | ICD-10-CM | POA: Diagnosis not present

## 2020-06-04 DIAGNOSIS — H401111 Primary open-angle glaucoma, right eye, mild stage: Secondary | ICD-10-CM | POA: Diagnosis not present

## 2020-06-04 DIAGNOSIS — H401122 Primary open-angle glaucoma, left eye, moderate stage: Secondary | ICD-10-CM | POA: Diagnosis not present

## 2020-07-16 DIAGNOSIS — R69 Illness, unspecified: Secondary | ICD-10-CM | POA: Diagnosis not present

## 2020-08-18 DIAGNOSIS — R69 Illness, unspecified: Secondary | ICD-10-CM | POA: Diagnosis not present

## 2020-08-22 DIAGNOSIS — E785 Hyperlipidemia, unspecified: Secondary | ICD-10-CM | POA: Diagnosis not present

## 2020-08-22 DIAGNOSIS — R946 Abnormal results of thyroid function studies: Secondary | ICD-10-CM | POA: Diagnosis not present

## 2020-08-22 DIAGNOSIS — N400 Enlarged prostate without lower urinary tract symptoms: Secondary | ICD-10-CM | POA: Diagnosis not present

## 2020-08-22 DIAGNOSIS — E039 Hypothyroidism, unspecified: Secondary | ICD-10-CM | POA: Diagnosis not present

## 2020-08-22 DIAGNOSIS — E559 Vitamin D deficiency, unspecified: Secondary | ICD-10-CM | POA: Diagnosis not present

## 2020-08-22 DIAGNOSIS — Z0000 Encounter for general adult medical examination without abnormal findings: Secondary | ICD-10-CM | POA: Diagnosis not present

## 2020-08-22 DIAGNOSIS — M109 Gout, unspecified: Secondary | ICD-10-CM | POA: Diagnosis not present

## 2020-08-26 DIAGNOSIS — M109 Gout, unspecified: Secondary | ICD-10-CM | POA: Diagnosis not present

## 2020-08-26 DIAGNOSIS — H409 Unspecified glaucoma: Secondary | ICD-10-CM | POA: Diagnosis not present

## 2020-08-26 DIAGNOSIS — E559 Vitamin D deficiency, unspecified: Secondary | ICD-10-CM | POA: Diagnosis not present

## 2020-08-26 DIAGNOSIS — R946 Abnormal results of thyroid function studies: Secondary | ICD-10-CM | POA: Diagnosis not present

## 2020-08-26 DIAGNOSIS — E785 Hyperlipidemia, unspecified: Secondary | ICD-10-CM | POA: Diagnosis not present

## 2020-08-26 DIAGNOSIS — N400 Enlarged prostate without lower urinary tract symptoms: Secondary | ICD-10-CM | POA: Diagnosis not present

## 2020-08-26 DIAGNOSIS — Z0000 Encounter for general adult medical examination without abnormal findings: Secondary | ICD-10-CM | POA: Diagnosis not present

## 2020-08-28 DIAGNOSIS — R3915 Urgency of urination: Secondary | ICD-10-CM | POA: Diagnosis not present

## 2020-08-28 DIAGNOSIS — N5201 Erectile dysfunction due to arterial insufficiency: Secondary | ICD-10-CM | POA: Diagnosis not present

## 2020-08-28 DIAGNOSIS — N401 Enlarged prostate with lower urinary tract symptoms: Secondary | ICD-10-CM | POA: Diagnosis not present

## 2020-12-02 DIAGNOSIS — H401111 Primary open-angle glaucoma, right eye, mild stage: Secondary | ICD-10-CM | POA: Diagnosis not present

## 2020-12-02 DIAGNOSIS — H26493 Other secondary cataract, bilateral: Secondary | ICD-10-CM | POA: Diagnosis not present

## 2020-12-02 DIAGNOSIS — H11131 Conjunctival pigmentations, right eye: Secondary | ICD-10-CM | POA: Diagnosis not present

## 2020-12-02 DIAGNOSIS — H401122 Primary open-angle glaucoma, left eye, moderate stage: Secondary | ICD-10-CM | POA: Diagnosis not present

## 2020-12-16 ENCOUNTER — Ambulatory Visit
Admission: RE | Admit: 2020-12-16 | Discharge: 2020-12-16 | Disposition: A | Discharge status: Home / Self Care | Payer: Medicare HMO | Source: Ambulatory Visit | Attending: Family Medicine | Admitting: Family Medicine

## 2020-12-16 ENCOUNTER — Other Ambulatory Visit: Payer: Self-pay | Admitting: Family Medicine

## 2020-12-16 DIAGNOSIS — R059 Cough, unspecified: Secondary | ICD-10-CM

## 2021-01-28 DIAGNOSIS — M109 Gout, unspecified: Secondary | ICD-10-CM | POA: Diagnosis not present

## 2021-01-28 DIAGNOSIS — M79671 Pain in right foot: Secondary | ICD-10-CM | POA: Diagnosis not present

## 2021-01-28 DIAGNOSIS — M7661 Achilles tendinitis, right leg: Secondary | ICD-10-CM | POA: Diagnosis not present

## 2021-01-30 DIAGNOSIS — S46911A Strain of unspecified muscle, fascia and tendon at shoulder and upper arm level, right arm, initial encounter: Secondary | ICD-10-CM | POA: Diagnosis not present

## 2021-01-30 DIAGNOSIS — S86011A Strain of right Achilles tendon, initial encounter: Secondary | ICD-10-CM | POA: Diagnosis not present

## 2021-02-25 DIAGNOSIS — M7660 Achilles tendinitis, unspecified leg: Secondary | ICD-10-CM | POA: Diagnosis not present

## 2021-02-25 DIAGNOSIS — E785 Hyperlipidemia, unspecified: Secondary | ICD-10-CM | POA: Diagnosis not present

## 2021-02-25 DIAGNOSIS — E559 Vitamin D deficiency, unspecified: Secondary | ICD-10-CM | POA: Diagnosis not present

## 2021-02-25 DIAGNOSIS — E039 Hypothyroidism, unspecified: Secondary | ICD-10-CM | POA: Diagnosis not present

## 2021-08-25 DIAGNOSIS — H401122 Primary open-angle glaucoma, left eye, moderate stage: Secondary | ICD-10-CM | POA: Diagnosis not present

## 2021-08-25 DIAGNOSIS — H401111 Primary open-angle glaucoma, right eye, mild stage: Secondary | ICD-10-CM | POA: Diagnosis not present

## 2021-08-28 DIAGNOSIS — N400 Enlarged prostate without lower urinary tract symptoms: Secondary | ICD-10-CM | POA: Diagnosis not present

## 2021-08-28 DIAGNOSIS — M109 Gout, unspecified: Secondary | ICD-10-CM | POA: Diagnosis not present

## 2021-08-28 DIAGNOSIS — E559 Vitamin D deficiency, unspecified: Secondary | ICD-10-CM | POA: Diagnosis not present

## 2021-08-28 DIAGNOSIS — E039 Hypothyroidism, unspecified: Secondary | ICD-10-CM | POA: Diagnosis not present

## 2021-08-28 DIAGNOSIS — E785 Hyperlipidemia, unspecified: Secondary | ICD-10-CM | POA: Diagnosis not present

## 2021-08-28 DIAGNOSIS — Z0000 Encounter for general adult medical examination without abnormal findings: Secondary | ICD-10-CM | POA: Diagnosis not present

## 2021-09-03 DIAGNOSIS — R3915 Urgency of urination: Secondary | ICD-10-CM | POA: Diagnosis not present

## 2021-09-03 DIAGNOSIS — N401 Enlarged prostate with lower urinary tract symptoms: Secondary | ICD-10-CM | POA: Diagnosis not present

## 2021-09-03 DIAGNOSIS — N5201 Erectile dysfunction due to arterial insufficiency: Secondary | ICD-10-CM | POA: Diagnosis not present

## 2021-09-22 DIAGNOSIS — E559 Vitamin D deficiency, unspecified: Secondary | ICD-10-CM | POA: Diagnosis not present

## 2021-09-22 DIAGNOSIS — E785 Hyperlipidemia, unspecified: Secondary | ICD-10-CM | POA: Diagnosis not present

## 2021-09-22 DIAGNOSIS — N400 Enlarged prostate without lower urinary tract symptoms: Secondary | ICD-10-CM | POA: Diagnosis not present

## 2021-09-22 DIAGNOSIS — R946 Abnormal results of thyroid function studies: Secondary | ICD-10-CM | POA: Diagnosis not present

## 2021-09-22 DIAGNOSIS — Z0000 Encounter for general adult medical examination without abnormal findings: Secondary | ICD-10-CM | POA: Diagnosis not present

## 2021-09-22 DIAGNOSIS — H409 Unspecified glaucoma: Secondary | ICD-10-CM | POA: Diagnosis not present

## 2021-09-29 DIAGNOSIS — H401111 Primary open-angle glaucoma, right eye, mild stage: Secondary | ICD-10-CM | POA: Diagnosis not present

## 2021-09-29 DIAGNOSIS — H401122 Primary open-angle glaucoma, left eye, moderate stage: Secondary | ICD-10-CM | POA: Diagnosis not present

## 2021-11-07 IMAGING — DX DG CHEST 2V
2 series · 2 of 2 positions shown · non-contrast
Comparison: 07/29/2011

CLINICAL DATA: Cough for 1 week

EXAM:
CHEST - 2 VIEW

[dg chest 2 view (1 of 2)]
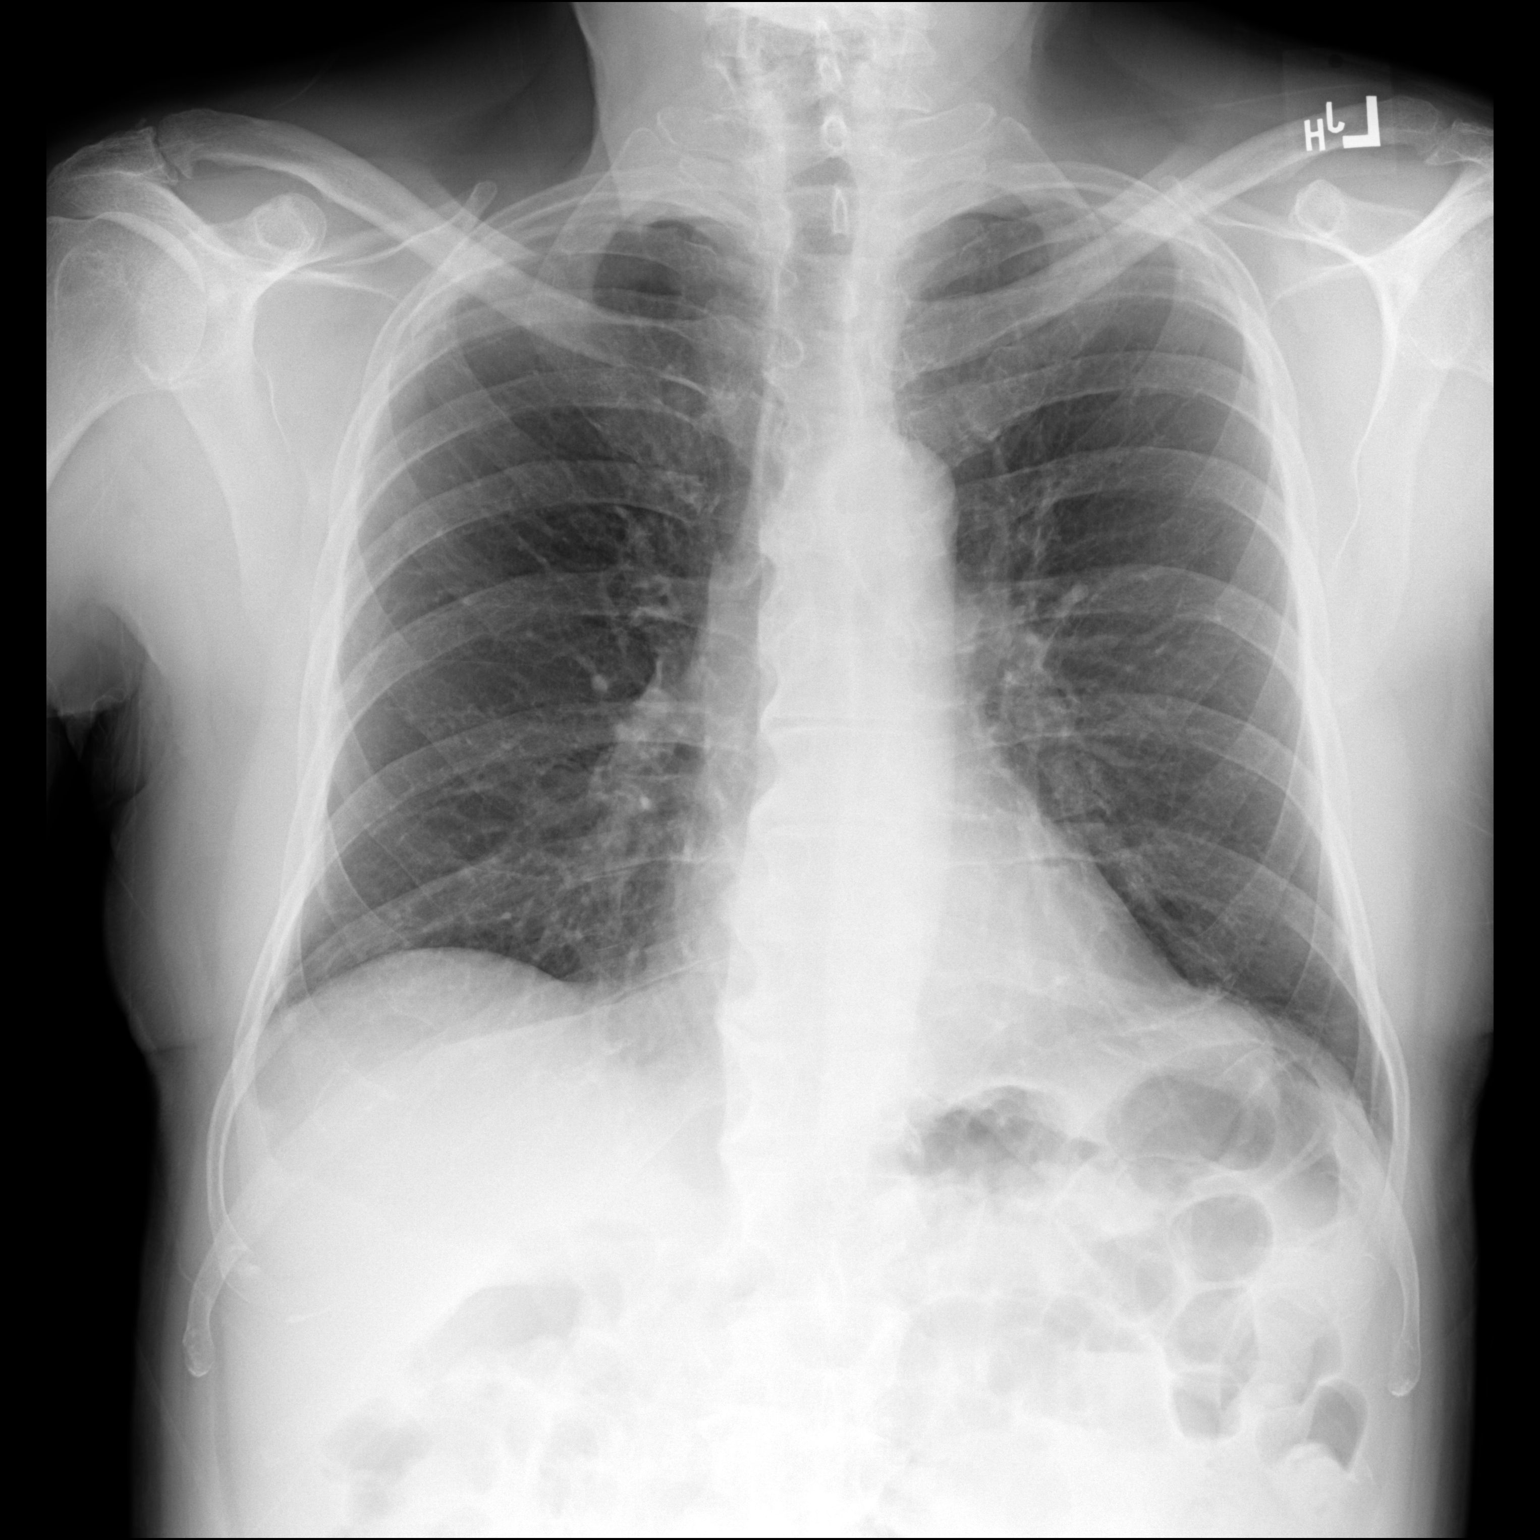

[dg chest 2 view (2 of 2)]
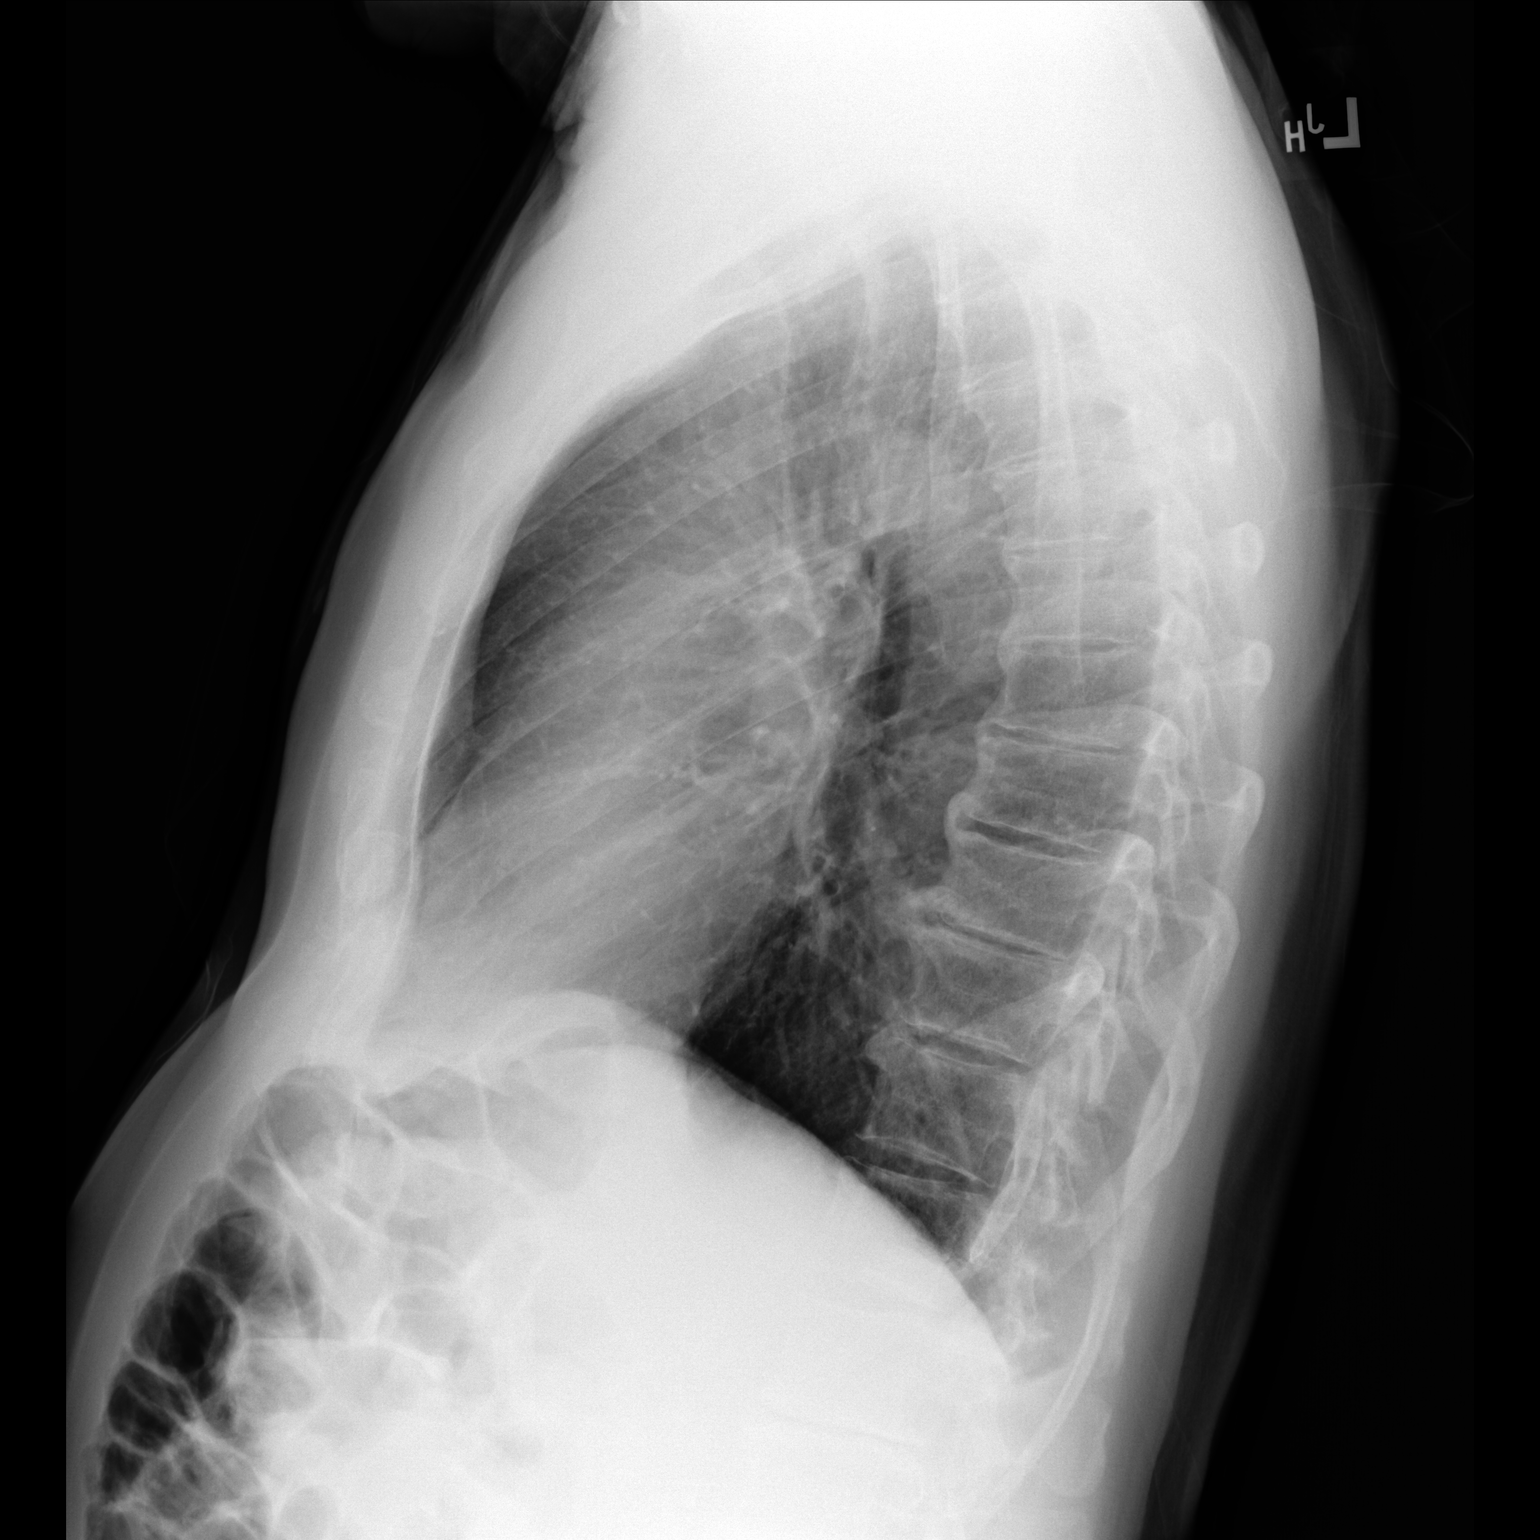

[2 of 2 positions shown; findings below may reference images not displayed]

FINDINGS: Cardiac shadow is within normal limits. The lungs are clear
bilaterally. No acute bony abnormality is noted.
IMPRESSION: No active cardiopulmonary disease.

## 2022-01-22 DIAGNOSIS — M67872 Other specified disorders of synovium, left ankle and foot: Secondary | ICD-10-CM | POA: Diagnosis not present

## 2022-01-25 DIAGNOSIS — M7662 Achilles tendinitis, left leg: Secondary | ICD-10-CM | POA: Diagnosis not present

## 2022-01-27 DIAGNOSIS — H401122 Primary open-angle glaucoma, left eye, moderate stage: Secondary | ICD-10-CM | POA: Diagnosis not present

## 2022-01-27 DIAGNOSIS — H401111 Primary open-angle glaucoma, right eye, mild stage: Secondary | ICD-10-CM | POA: Diagnosis not present

## 2022-01-27 DIAGNOSIS — H04123 Dry eye syndrome of bilateral lacrimal glands: Secondary | ICD-10-CM | POA: Diagnosis not present

## 2022-01-27 DIAGNOSIS — H26493 Other secondary cataract, bilateral: Secondary | ICD-10-CM | POA: Diagnosis not present

## 2022-02-11 DIAGNOSIS — M67872 Other specified disorders of synovium, left ankle and foot: Secondary | ICD-10-CM | POA: Diagnosis not present

## 2022-02-17 DIAGNOSIS — H401122 Primary open-angle glaucoma, left eye, moderate stage: Secondary | ICD-10-CM | POA: Diagnosis not present

## 2022-02-17 DIAGNOSIS — H04123 Dry eye syndrome of bilateral lacrimal glands: Secondary | ICD-10-CM | POA: Diagnosis not present

## 2022-02-17 DIAGNOSIS — H401111 Primary open-angle glaucoma, right eye, mild stage: Secondary | ICD-10-CM | POA: Diagnosis not present

## 2022-02-24 DIAGNOSIS — M25572 Pain in left ankle and joints of left foot: Secondary | ICD-10-CM | POA: Diagnosis not present

## 2022-04-12 DIAGNOSIS — E039 Hypothyroidism, unspecified: Secondary | ICD-10-CM | POA: Diagnosis not present

## 2022-04-12 DIAGNOSIS — H409 Unspecified glaucoma: Secondary | ICD-10-CM | POA: Diagnosis not present

## 2022-04-12 DIAGNOSIS — E785 Hyperlipidemia, unspecified: Secondary | ICD-10-CM | POA: Diagnosis not present

## 2022-04-12 DIAGNOSIS — E559 Vitamin D deficiency, unspecified: Secondary | ICD-10-CM | POA: Diagnosis not present

## 2022-06-03 DIAGNOSIS — Z7185 Encounter for immunization safety counseling: Secondary | ICD-10-CM | POA: Diagnosis not present

## 2022-06-03 DIAGNOSIS — J219 Acute bronchiolitis, unspecified: Secondary | ICD-10-CM | POA: Diagnosis not present

## 2022-06-03 DIAGNOSIS — J069 Acute upper respiratory infection, unspecified: Secondary | ICD-10-CM | POA: Diagnosis not present

## 2022-06-17 DIAGNOSIS — Z79899 Other long term (current) drug therapy: Secondary | ICD-10-CM | POA: Diagnosis not present

## 2022-06-17 DIAGNOSIS — H401122 Primary open-angle glaucoma, left eye, moderate stage: Secondary | ICD-10-CM | POA: Diagnosis not present

## 2022-06-17 DIAGNOSIS — H401111 Primary open-angle glaucoma, right eye, mild stage: Secondary | ICD-10-CM | POA: Diagnosis not present

## 2022-08-25 DIAGNOSIS — H401122 Primary open-angle glaucoma, left eye, moderate stage: Secondary | ICD-10-CM | POA: Diagnosis not present

## 2022-08-26 DIAGNOSIS — H401122 Primary open-angle glaucoma, left eye, moderate stage: Secondary | ICD-10-CM | POA: Diagnosis not present

## 2022-08-26 DIAGNOSIS — Z79899 Other long term (current) drug therapy: Secondary | ICD-10-CM | POA: Diagnosis not present

## 2022-08-26 DIAGNOSIS — Z48810 Encounter for surgical aftercare following surgery on the sense organs: Secondary | ICD-10-CM | POA: Diagnosis not present

## 2022-08-26 DIAGNOSIS — H401111 Primary open-angle glaucoma, right eye, mild stage: Secondary | ICD-10-CM | POA: Diagnosis not present

## 2022-09-02 DIAGNOSIS — Z48810 Encounter for surgical aftercare following surgery on the sense organs: Secondary | ICD-10-CM | POA: Diagnosis not present

## 2022-09-02 DIAGNOSIS — H401111 Primary open-angle glaucoma, right eye, mild stage: Secondary | ICD-10-CM | POA: Diagnosis not present

## 2022-09-02 DIAGNOSIS — H401122 Primary open-angle glaucoma, left eye, moderate stage: Secondary | ICD-10-CM | POA: Diagnosis not present

## 2022-09-02 DIAGNOSIS — Z79899 Other long term (current) drug therapy: Secondary | ICD-10-CM | POA: Diagnosis not present

## 2022-09-24 DIAGNOSIS — E559 Vitamin D deficiency, unspecified: Secondary | ICD-10-CM | POA: Diagnosis not present

## 2022-09-24 DIAGNOSIS — E785 Hyperlipidemia, unspecified: Secondary | ICD-10-CM | POA: Diagnosis not present

## 2022-09-24 DIAGNOSIS — M109 Gout, unspecified: Secondary | ICD-10-CM | POA: Diagnosis not present

## 2022-09-24 DIAGNOSIS — N400 Enlarged prostate without lower urinary tract symptoms: Secondary | ICD-10-CM | POA: Diagnosis not present

## 2022-09-24 DIAGNOSIS — E039 Hypothyroidism, unspecified: Secondary | ICD-10-CM | POA: Diagnosis not present

## 2022-09-29 DIAGNOSIS — R946 Abnormal results of thyroid function studies: Secondary | ICD-10-CM | POA: Diagnosis not present

## 2022-09-29 DIAGNOSIS — Z0000 Encounter for general adult medical examination without abnormal findings: Secondary | ICD-10-CM | POA: Diagnosis not present

## 2022-09-29 DIAGNOSIS — E785 Hyperlipidemia, unspecified: Secondary | ICD-10-CM | POA: Diagnosis not present

## 2022-09-29 DIAGNOSIS — H409 Unspecified glaucoma: Secondary | ICD-10-CM | POA: Diagnosis not present

## 2022-09-29 DIAGNOSIS — E559 Vitamin D deficiency, unspecified: Secondary | ICD-10-CM | POA: Diagnosis not present

## 2022-09-29 DIAGNOSIS — Z23 Encounter for immunization: Secondary | ICD-10-CM | POA: Diagnosis not present

## 2022-09-29 DIAGNOSIS — Z1211 Encounter for screening for malignant neoplasm of colon: Secondary | ICD-10-CM | POA: Diagnosis not present

## 2022-09-29 DIAGNOSIS — Z7184 Encounter for health counseling related to travel: Secondary | ICD-10-CM | POA: Diagnosis not present

## 2022-09-29 DIAGNOSIS — N400 Enlarged prostate without lower urinary tract symptoms: Secondary | ICD-10-CM | POA: Diagnosis not present

## 2022-09-30 DIAGNOSIS — H02886 Meibomian gland dysfunction of left eye, unspecified eyelid: Secondary | ICD-10-CM | POA: Diagnosis not present

## 2022-09-30 DIAGNOSIS — H02833 Dermatochalasis of right eye, unspecified eyelid: Secondary | ICD-10-CM | POA: Diagnosis not present

## 2022-09-30 DIAGNOSIS — H401122 Primary open-angle glaucoma, left eye, moderate stage: Secondary | ICD-10-CM | POA: Diagnosis not present

## 2022-09-30 DIAGNOSIS — H16143 Punctate keratitis, bilateral: Secondary | ICD-10-CM | POA: Diagnosis not present

## 2022-09-30 DIAGNOSIS — H02836 Dermatochalasis of left eye, unspecified eyelid: Secondary | ICD-10-CM | POA: Diagnosis not present

## 2022-09-30 DIAGNOSIS — H02883 Meibomian gland dysfunction of right eye, unspecified eyelid: Secondary | ICD-10-CM | POA: Diagnosis not present

## 2022-09-30 DIAGNOSIS — H401111 Primary open-angle glaucoma, right eye, mild stage: Secondary | ICD-10-CM | POA: Diagnosis not present

## 2022-09-30 DIAGNOSIS — Z961 Presence of intraocular lens: Secondary | ICD-10-CM | POA: Diagnosis not present

## 2022-09-30 DIAGNOSIS — H47393 Other disorders of optic disc, bilateral: Secondary | ICD-10-CM | POA: Diagnosis not present

## 2022-09-30 DIAGNOSIS — Z48810 Encounter for surgical aftercare following surgery on the sense organs: Secondary | ICD-10-CM | POA: Diagnosis not present

## 2022-09-30 DIAGNOSIS — Z9689 Presence of other specified functional implants: Secondary | ICD-10-CM | POA: Diagnosis not present

## 2022-10-04 DIAGNOSIS — N5201 Erectile dysfunction due to arterial insufficiency: Secondary | ICD-10-CM | POA: Diagnosis not present

## 2022-10-04 DIAGNOSIS — R3915 Urgency of urination: Secondary | ICD-10-CM | POA: Diagnosis not present

## 2022-10-04 DIAGNOSIS — N401 Enlarged prostate with lower urinary tract symptoms: Secondary | ICD-10-CM | POA: Diagnosis not present

## 2022-11-17 DIAGNOSIS — Z1211 Encounter for screening for malignant neoplasm of colon: Secondary | ICD-10-CM | POA: Diagnosis not present

## 2022-11-17 DIAGNOSIS — K219 Gastro-esophageal reflux disease without esophagitis: Secondary | ICD-10-CM | POA: Diagnosis not present

## 2022-11-17 DIAGNOSIS — F458 Other somatoform disorders: Secondary | ICD-10-CM | POA: Diagnosis not present

## 2022-11-17 DIAGNOSIS — R69 Illness, unspecified: Secondary | ICD-10-CM | POA: Diagnosis not present

## 2022-11-23 DIAGNOSIS — H401111 Primary open-angle glaucoma, right eye, mild stage: Secondary | ICD-10-CM | POA: Diagnosis not present

## 2022-11-23 DIAGNOSIS — H401122 Primary open-angle glaucoma, left eye, moderate stage: Secondary | ICD-10-CM | POA: Diagnosis not present

## 2022-11-23 DIAGNOSIS — Z9689 Presence of other specified functional implants: Secondary | ICD-10-CM | POA: Diagnosis not present

## 2022-11-23 DIAGNOSIS — Z48810 Encounter for surgical aftercare following surgery on the sense organs: Secondary | ICD-10-CM | POA: Diagnosis not present

## 2023-01-11 DIAGNOSIS — R1310 Dysphagia, unspecified: Secondary | ICD-10-CM | POA: Diagnosis not present

## 2023-01-11 DIAGNOSIS — K319 Disease of stomach and duodenum, unspecified: Secondary | ICD-10-CM | POA: Diagnosis not present

## 2023-01-11 DIAGNOSIS — Z1211 Encounter for screening for malignant neoplasm of colon: Secondary | ICD-10-CM | POA: Diagnosis not present

## 2023-01-11 DIAGNOSIS — K5730 Diverticulosis of large intestine without perforation or abscess without bleeding: Secondary | ICD-10-CM | POA: Diagnosis not present

## 2023-01-11 DIAGNOSIS — K3189 Other diseases of stomach and duodenum: Secondary | ICD-10-CM | POA: Diagnosis not present

## 2023-01-11 DIAGNOSIS — K2970 Gastritis, unspecified, without bleeding: Secondary | ICD-10-CM | POA: Diagnosis not present

## 2023-01-18 DIAGNOSIS — H401122 Primary open-angle glaucoma, left eye, moderate stage: Secondary | ICD-10-CM | POA: Diagnosis not present

## 2023-01-18 DIAGNOSIS — Z9689 Presence of other specified functional implants: Secondary | ICD-10-CM | POA: Diagnosis not present

## 2023-01-18 DIAGNOSIS — H401111 Primary open-angle glaucoma, right eye, mild stage: Secondary | ICD-10-CM | POA: Diagnosis not present

## 2023-02-09 DIAGNOSIS — R1310 Dysphagia, unspecified: Secondary | ICD-10-CM | POA: Diagnosis not present

## 2023-04-04 DIAGNOSIS — E559 Vitamin D deficiency, unspecified: Secondary | ICD-10-CM | POA: Diagnosis not present

## 2023-04-04 DIAGNOSIS — H409 Unspecified glaucoma: Secondary | ICD-10-CM | POA: Diagnosis not present

## 2023-04-04 DIAGNOSIS — E039 Hypothyroidism, unspecified: Secondary | ICD-10-CM | POA: Diagnosis not present

## 2023-04-04 DIAGNOSIS — E785 Hyperlipidemia, unspecified: Secondary | ICD-10-CM | POA: Diagnosis not present

## 2023-04-25 DIAGNOSIS — H26493 Other secondary cataract, bilateral: Secondary | ICD-10-CM | POA: Diagnosis not present

## 2023-04-25 DIAGNOSIS — D3101 Benign neoplasm of right conjunctiva: Secondary | ICD-10-CM | POA: Diagnosis not present

## 2023-04-25 DIAGNOSIS — H401123 Primary open-angle glaucoma, left eye, severe stage: Secondary | ICD-10-CM | POA: Diagnosis not present

## 2023-04-25 DIAGNOSIS — H04123 Dry eye syndrome of bilateral lacrimal glands: Secondary | ICD-10-CM | POA: Diagnosis not present

## 2023-09-07 DIAGNOSIS — H401123 Primary open-angle glaucoma, left eye, severe stage: Secondary | ICD-10-CM | POA: Diagnosis not present

## 2023-10-07 DIAGNOSIS — E785 Hyperlipidemia, unspecified: Secondary | ICD-10-CM | POA: Diagnosis not present

## 2023-10-07 DIAGNOSIS — Z0000 Encounter for general adult medical examination without abnormal findings: Secondary | ICD-10-CM | POA: Diagnosis not present

## 2023-10-07 DIAGNOSIS — E559 Vitamin D deficiency, unspecified: Secondary | ICD-10-CM | POA: Diagnosis not present

## 2023-10-07 DIAGNOSIS — M109 Gout, unspecified: Secondary | ICD-10-CM | POA: Diagnosis not present

## 2023-10-07 DIAGNOSIS — H409 Unspecified glaucoma: Secondary | ICD-10-CM | POA: Diagnosis not present

## 2023-10-07 DIAGNOSIS — N400 Enlarged prostate without lower urinary tract symptoms: Secondary | ICD-10-CM | POA: Diagnosis not present

## 2023-10-07 DIAGNOSIS — R946 Abnormal results of thyroid function studies: Secondary | ICD-10-CM | POA: Diagnosis not present

## 2023-10-24 DIAGNOSIS — N3289 Other specified disorders of bladder: Secondary | ICD-10-CM | POA: Diagnosis not present

## 2023-10-24 DIAGNOSIS — R310 Gross hematuria: Secondary | ICD-10-CM | POA: Diagnosis not present

## 2023-10-24 DIAGNOSIS — K7689 Other specified diseases of liver: Secondary | ICD-10-CM | POA: Diagnosis not present

## 2023-10-24 DIAGNOSIS — N400 Enlarged prostate without lower urinary tract symptoms: Secondary | ICD-10-CM | POA: Diagnosis not present

## 2023-10-25 DIAGNOSIS — L723 Sebaceous cyst: Secondary | ICD-10-CM | POA: Diagnosis not present

## 2023-10-25 DIAGNOSIS — L570 Actinic keratosis: Secondary | ICD-10-CM | POA: Diagnosis not present

## 2023-10-25 DIAGNOSIS — L820 Inflamed seborrheic keratosis: Secondary | ICD-10-CM | POA: Diagnosis not present

## 2023-12-02 DIAGNOSIS — R3915 Urgency of urination: Secondary | ICD-10-CM | POA: Diagnosis not present

## 2023-12-02 DIAGNOSIS — N401 Enlarged prostate with lower urinary tract symptoms: Secondary | ICD-10-CM | POA: Diagnosis not present

## 2023-12-02 DIAGNOSIS — R310 Gross hematuria: Secondary | ICD-10-CM | POA: Diagnosis not present

## 2023-12-27 DIAGNOSIS — Z9689 Presence of other specified functional implants: Secondary | ICD-10-CM | POA: Diagnosis not present

## 2023-12-27 DIAGNOSIS — H401111 Primary open-angle glaucoma, right eye, mild stage: Secondary | ICD-10-CM | POA: Diagnosis not present

## 2023-12-27 DIAGNOSIS — H401122 Primary open-angle glaucoma, left eye, moderate stage: Secondary | ICD-10-CM | POA: Diagnosis not present

## 2024-02-28 DIAGNOSIS — R030 Elevated blood-pressure reading, without diagnosis of hypertension: Secondary | ICD-10-CM | POA: Diagnosis not present

## 2024-02-28 DIAGNOSIS — L089 Local infection of the skin and subcutaneous tissue, unspecified: Secondary | ICD-10-CM | POA: Diagnosis not present

## 2024-02-28 DIAGNOSIS — S80812A Abrasion, left lower leg, initial encounter: Secondary | ICD-10-CM | POA: Diagnosis not present

## 2024-03-28 DIAGNOSIS — H26493 Other secondary cataract, bilateral: Secondary | ICD-10-CM | POA: Diagnosis not present

## 2024-03-28 DIAGNOSIS — H04123 Dry eye syndrome of bilateral lacrimal glands: Secondary | ICD-10-CM | POA: Diagnosis not present

## 2024-03-28 DIAGNOSIS — Z961 Presence of intraocular lens: Secondary | ICD-10-CM | POA: Diagnosis not present

## 2024-03-28 DIAGNOSIS — H401123 Primary open-angle glaucoma, left eye, severe stage: Secondary | ICD-10-CM | POA: Diagnosis not present

## 2024-04-05 DIAGNOSIS — R946 Abnormal results of thyroid function studies: Secondary | ICD-10-CM | POA: Diagnosis not present

## 2024-04-05 DIAGNOSIS — D72819 Decreased white blood cell count, unspecified: Secondary | ICD-10-CM | POA: Diagnosis not present

## 2024-07-27 DIAGNOSIS — E785 Hyperlipidemia, unspecified: Secondary | ICD-10-CM | POA: Diagnosis not present

## 2024-07-27 DIAGNOSIS — I10 Essential (primary) hypertension: Secondary | ICD-10-CM | POA: Diagnosis not present

## 2024-08-02 DIAGNOSIS — H401123 Primary open-angle glaucoma, left eye, severe stage: Secondary | ICD-10-CM | POA: Diagnosis not present

## 2024-08-02 DIAGNOSIS — H26493 Other secondary cataract, bilateral: Secondary | ICD-10-CM | POA: Diagnosis not present

## 2024-08-10 DIAGNOSIS — I10 Essential (primary) hypertension: Secondary | ICD-10-CM | POA: Diagnosis not present
